# Patient Record
Sex: Female | Born: 1965 | Race: White | Hispanic: No | Marital: Married | Smoking: Never smoker
Health system: Southern US, Community
[De-identification: ages and names within clinical notes are randomized; demographics above are authoritative.]

## PROBLEM LIST (undated history)

## (undated) DIAGNOSIS — F419 Anxiety disorder, unspecified: Secondary | ICD-10-CM

## (undated) DIAGNOSIS — M67439 Ganglion, unspecified wrist: Secondary | ICD-10-CM

## (undated) HISTORY — PX: BREAST CYST ASPIRATION: SHX578

## (undated) HISTORY — PX: TUBAL LIGATION: SHX77

## (undated) HISTORY — DX: Ganglion, unspecified wrist: M67.439

## (undated) HISTORY — DX: Anxiety disorder, unspecified: F41.9

---

## 1999-01-03 ENCOUNTER — Other Ambulatory Visit: Admission: RE | Admit: 1999-01-03 | Discharge: 1999-01-03 | Payer: Self-pay | Admitting: Obstetrics and Gynecology

## 1999-12-21 ENCOUNTER — Other Ambulatory Visit: Admission: RE | Admit: 1999-12-21 | Discharge: 1999-12-21 | Payer: Self-pay | Admitting: Gynecology

## 2000-01-03 ENCOUNTER — Encounter: Admission: RE | Admit: 2000-01-03 | Discharge: 2000-04-02 | Payer: Self-pay | Admitting: Gynecology

## 2000-07-01 ENCOUNTER — Inpatient Hospital Stay (HOSPITAL_COMMUNITY): Admission: AD | Admit: 2000-07-01 | Discharge: 2000-07-03 | Payer: Self-pay | Admitting: Gynecology

## 2000-07-01 ENCOUNTER — Encounter (INDEPENDENT_AMBULATORY_CARE_PROVIDER_SITE_OTHER): Payer: Self-pay | Admitting: Specialist

## 2000-07-06 ENCOUNTER — Inpatient Hospital Stay (HOSPITAL_COMMUNITY): Admission: AD | Admit: 2000-07-06 | Discharge: 2000-07-06 | Payer: Self-pay | Admitting: Gynecology

## 2000-08-05 ENCOUNTER — Other Ambulatory Visit: Admission: RE | Admit: 2000-08-05 | Discharge: 2000-08-05 | Payer: Self-pay | Admitting: Gynecology

## 2001-06-23 ENCOUNTER — Other Ambulatory Visit: Admission: RE | Admit: 2001-06-23 | Discharge: 2001-06-23 | Payer: Self-pay | Admitting: Gynecology

## 2001-12-30 ENCOUNTER — Encounter (INDEPENDENT_AMBULATORY_CARE_PROVIDER_SITE_OTHER): Payer: Self-pay

## 2001-12-30 ENCOUNTER — Inpatient Hospital Stay (HOSPITAL_COMMUNITY): Admission: AD | Admit: 2001-12-30 | Discharge: 2002-01-01 | Payer: Self-pay | Admitting: Gynecology

## 2002-02-04 ENCOUNTER — Other Ambulatory Visit: Admission: RE | Admit: 2002-02-04 | Discharge: 2002-02-04 | Payer: Self-pay | Admitting: Gynecology

## 2003-02-12 ENCOUNTER — Other Ambulatory Visit: Admission: RE | Admit: 2003-02-12 | Discharge: 2003-02-12 | Payer: Self-pay | Admitting: Gynecology

## 2003-03-10 ENCOUNTER — Encounter: Payer: Self-pay | Admitting: Gynecology

## 2003-03-10 ENCOUNTER — Ambulatory Visit (HOSPITAL_COMMUNITY): Admission: RE | Admit: 2003-03-10 | Discharge: 2003-03-10 | Payer: Self-pay | Admitting: Gynecology

## 2003-07-27 ENCOUNTER — Other Ambulatory Visit: Admission: RE | Admit: 2003-07-27 | Discharge: 2003-07-27 | Payer: Self-pay | Admitting: Gynecology

## 2008-12-30 ENCOUNTER — Other Ambulatory Visit: Admission: RE | Admit: 2008-12-30 | Discharge: 2008-12-30 | Payer: Self-pay | Admitting: Gynecology

## 2008-12-30 ENCOUNTER — Encounter: Payer: Self-pay | Admitting: Gynecology

## 2008-12-30 ENCOUNTER — Ambulatory Visit: Payer: Self-pay | Admitting: Gynecology

## 2009-01-04 ENCOUNTER — Ambulatory Visit (HOSPITAL_COMMUNITY): Admission: RE | Admit: 2009-01-04 | Discharge: 2009-01-04 | Payer: Self-pay | Admitting: Gynecology

## 2010-01-12 ENCOUNTER — Ambulatory Visit: Payer: Self-pay | Admitting: Gynecology

## 2010-01-12 ENCOUNTER — Other Ambulatory Visit: Admission: RE | Admit: 2010-01-12 | Discharge: 2010-01-12 | Payer: Self-pay | Admitting: Gynecology

## 2010-01-19 ENCOUNTER — Ambulatory Visit (HOSPITAL_COMMUNITY): Admission: RE | Admit: 2010-01-19 | Discharge: 2010-01-19 | Payer: Self-pay | Admitting: Gynecology

## 2010-06-22 ENCOUNTER — Ambulatory Visit: Payer: Self-pay | Admitting: Gynecology

## 2010-12-08 NOTE — Discharge Summary (Signed)
[facility]  Patient:    Kimberly Mcmahon, Kimberly Mcmahon                        MRN: [id number] Adm. Date:  [id number] Disc. Date: [id number] Attending:  Tonye Royalty Dictator:   Antony Contras, [facility]                           Discharge Summary  DISCHARGE DIAGNOSES:          1. Intrauterine pregnancy at 38 weeks.                               2. Succenturiate placenta.  PROCEDURES:                   Normal spontaneous vaginal delivery of viable infant over an intact perineum with second degree vaginoperineal tear and right and left labia majora tear, which were repaired.  HISTORY OF PRESENT ILLNESS:   The patient is a 45 year old, gravida 3, para 2-0-0-2, LMP October 08, 1999, [facility] July 14, 2000.  Her only prenatal risk factory well-appearing a succenturiate placenta which was noted on a 20-week ultrasound.  PRENATAL LABORATORIES:        Blood type A+.  Antibody screen negative.  RPR, HBSAG, and HIV nonreactive.  Rubella immune.  The patient declined AFP.  GBS was positive.  HOSPITAL COURSE AND TREATMENT:                    The patient was admitted on July 01, 2000, after presenting to the office for prenatal visit and stating that she had decreased fetal movement.  Her cervix was found to be 1-2 cm dilated, 70% effaced, and -2 station.  She was placed on the monitor and found to be contracting every two to three minutes with good fetal heart reactivity.  She was sent to maternity admissions where monitoring was continued and she was found dilated to have progressed to 3-4 cm, 80%, and -1 station.  GBS prophylaxis was initiated and artificial rupture of membranes performed.  The patient did progress to complete dilatation and delivered an Apgar 9 and 9, 7 pound 10 ounce, female infant over an intact perineum, sustaining second vaginoperitoneal tear and right and left labia majora tears, which were remained.  During her postpartum course, she remained  afebrile, had no difficulty voiding, and was able to be discharged on her second postpartum day in satisfactory condition.  LABORATORY DATA:              CBC:  Hematocrit 31.6, hemoglobin 11.1, platelets 278.  DISPOSITION:                  Follow up in six weeks.  DISCHARGE MEDICATIONS:        Continue prenatal vitamins and iron.  Motrin and Tylox for pain. DD:  07/29/00 TD:  07/29/00 Job: [id number]

## 2010-12-08 NOTE — Op Note (Signed)
[facility]  Patient:    SAMARAH, HOGLE Visit Number: [id number] MRN: [id number]          Service Type: OBS Location: 910A 9103 01 Attending Physician:  Douglass Rivers Dictated by:   Douglass Rivers, M.D. Proc. Date: 12/31/01 Admit Date:  12/30/2001 Discharge Date: 01/01/2002                             Operative Report  PREOPERATIVE DIAGNOSES:       Nondesired fertility postpartum.  POSTOPERATIVE DIAGNOSES:      Nondesired fertility postpartum.  PROCEDURE:                    Modified Pomeroy bilateral tubal ligation.  SURGEON:                      Douglass Rivers, M.D.  ANESTHESIA:                   Epidural.  FINDINGS:                     Normal tubes and ovaries bilateral.  PATHOLOGY:                    Mid portion of left and right tube.  PROCEDURE:                    The patient was taken to the operating room. Epidural anesthesia was redosed.  Placed in supine position.  Prepped and draped in usual sterile fashion after adequate anesthesia was assured.  The infraumbilical region was grasped with two Allis clamps and then incision was made.  The incision was carried through to the layer of fascia which was scored and extended laterally with the Mayo scissors.  The peritoneum was entered bluntly with the hemostat and that incision was also extended laterally.  An examining finger was then placed in the incision.  The tubo-ovarian ligament on the right was grasped, stabilized with the finger, and a Babcock was placed.  The ovary was visualized.  The tube was also visualized, grasped with a Babcock, and walked out to the fimbriated end.  Two free ties of 2-0 plain were then placed in the mid portion of the tube and the mid portion was excised.  The endosalpinx was then sharply dissected off additionally and noted to be hemostatic and the tube was returned back to the abdomen.  In a similar fashion the uterus was rotated.  The  tubo-ovarian ligament on the left was grasped.  The ovary was noted to be normal.  The tube was walked out to the fimbriated end and two free ties of 2-0 plain were placed in the mid portion and again the endosalpinx was sharply dissected off. The tube was noted to be hemostatic and returned back to the pelvis.  The fascia was then closed in a running layer with 0 Vicryl.  The skin was closed with 3-0 plain.  The incision was then injected with 0.25% Marcaine solution and Steri-Strips were placed.  The patient tolerated the procedure well. Sponge, lap, and needle counts correct x2.  Transferred back to the PACU in stable condition. Dictated by:   Douglass Rivers, M.D. Attending Physician:  Douglass Rivers DD:  12/31/01 TD:  01/02/02 Job: [id number]

## 2010-12-08 NOTE — Discharge Summary (Signed)
[facility]  Patient:    Kimberly Mcmahon, KIL Visit Number: [id number] MRN: [id number]          Service Type: OBS Location: 910A 9103 01 Attending Physician:  Douglass Rivers Dictated by:   Antony Contras, [facility] Admit Date:  12/30/2001 Discharge Date: 01/01/2002                             Discharge Summary  DISCHARGE DIAGNOSES:          1. Intrauterine pregnancy at term.                               2. Spontaneous onset of labor.                               3. Undesired fertility.  PROCEDURES:                   1. Normal spontaneous vaginal delivery of a                                  viable infant over an intact perineum.                               2. Bilateral tubal sterilization, modified                                  Pomeroy technique.  HISTORY OF PRESENT ILLNESS:   The patient is a 45 year old gravida 4 para 3, 0-0-3, with an EDC of January 09, 2002 by [facility].  The prenatal course was complicated by a previous history of positive GBS with last pregnancy, also undesired fertility.  PRENATAL LABORATORY DATA:     Blood type A-positive.  Antibody screen negative.  RPR, HBsAg, HIV nonreactive.  Rubella immune.  HOSPITAL COURSE:              The patient was admitted on December 30, 2001 with spontaneous onset of labor at 38-4/7th weeks.  She did progress to complete dilatation and delivered an Apgar 9 and 28 female infant weighing 7 pounds 7 ounces over an intact perineum, with no lacerations.  She did request tubal sterilization and the procedure was performed by Dr. Farrel Gobble.  Findings included normal tubes and ovaries bilaterally.  The patient was able to be discharged on her second postpartum day in satisfactory condition.  LABORATORY DATA:              CBC hematocrit 32.3, hemoglobin 10.9, WBC 9; platelets 245,000.  DISPOSITION:                  Follow-up in six weeks.  DISCHARGE MEDICATIONS:        1. Continue prenatal vitamins and iron.                              2. Tylox and Motrin for pain. Dictated by:   Antony Contras, [facility] Attending Physician:  Douglass Rivers DD:  01/26/02 TD:  01/28/02 Job: [id number]

## 2010-12-08 NOTE — H&P (Signed)
[facility]  Patient:    Kimberly Mcmahon, Kimberly Mcmahon                        MRN: [id number] Adm. Date:  [id number] Disc. Date: [id number] Attending:  Tonye Royalty                         History and Physical  HISTORY OF PRESENT ILLNESS:   The patient is a 45 year old, gravida 3, para 3, status post normal spontaneous vaginal delivery on December 10, who presented to the emergency room today to be checked as per my instructions due to the fact that she had called last night and said that she had bilateral breast tenderness.  She is not breastfeeding and has not breastfed in the past. She denied any fever, chills, nausea, or vomiting.  She had also stated that she had fallen on her buttocks three or four days ago.  She has full range of motion and has no neuropathy of her lower extremities and is ambulating without any problems.  She has a small ecchymosis on the right buttocks region.  Breast examination in the sitting and supine positions; both breasts were symmetrical in appearance.  There was no skin discoloration, no nipple inversion, no palpable masses, no supraclavicular, or axillary lymphadenopathy.  There was a mild redness on both breasts in the inferior portion due to the support from her bra that she has been placing.  She had been complaining of engorgement, but today felt much better and I asked her to come in to have her breasts examined as well as to have the lactation specialist give her some tips on helping with her breast tenderness as well as dealing with her engorgement.  PHYSICAL EXAMINATION:  VITAL SIGNS:                  Temperature 97.8, pulse 76, respirations 18, and blood pressure 130/84.  We will await for recommendation from the lactation specialist.  She will be released home and return to the office for her six-week postpartum visit. If she were to have a temperature elevation, she will call me and at that point we could  justify placing her on antibiotics.  At this point this does not appear to be a mastitis and the fact that she is not breastfeeding and there is no source of infection determined clinically at this point.  All questions were answered and will follow accordingly. DD:  07/06/00 TD:  07/06/00 Job: [id number]

## 2010-12-08 NOTE — H&P (Signed)
[facility]  Patient:    Kimberly Mcmahon, Kimberly Mcmahon                         MRN: [id number] Adm. Date:  07/01/00 Attending:  Gaetano Hawthorne. Lily Peer, M.D.                         History and Physical  3CHIEF COMPLAINT:              Contractions.  HISTORY:                      The patient is a 45 year old gravida 3, para 2, with an estimated date of confinement July 14, 2000.  The patient is currently at [redacted] weeks gestation, presented to the office for her prenatal visit and had stated that she had decreased fetal movement.  Her cervix was found to be 1-2 cm dilated, 70% effaced, -2 station.  She was placed on the monitor and found to be contracting every 2-3 minutes apart with good reactivity.  She was sent to maternity admission.  Her contractions were occurring at that point every 3-5 minutes.  She was instructed to ambulate and return back to be reexamined.  An hour later she was found to be 3-4 cm dilated, 80% effaced, -1 station with bulging membranes.  Fetal heart rate tracing was reactive.  The patients prenatal course has essentially been unremarkable.  Her 20 week ultrasound did demonstrate that there was a succenturiate placental low but no gross fetal anomalies, and otherwise her prenatal care had been uneventful with the exception that she was cultured at 37 weeks and found to be positive for GBS.  PAST MEDICAL HISTORY:         She denies any allergies.  She has had two normal spontaneous vaginal deliveries in 1990 and 1993.  Eight hours and six hours duration respectively with an average weight of 7.5 pounds at [redacted] weeks gestation.  She has had a CNB in 1999.  She had some form of eye surgery as a child and does not recall specifically what it was.  REVIEW OF SYSTEMS:            See [employer] form.  PHYSICAL EXAMINATION:  VITAL SIGNS:                  In the office today blood pressure was 130/80. There is no protein or ketone in her urine.  Weight  167 pounds.  Vertex presentation by [facility] maneuver.  HEENT:                        Unremarkable.  NECK:                         Supple, trachea midline, no carotid bruits, no thyromegaly.  LUNGS:                        Clear to auscultation without rhonchi or wheezes.  HEART:                        Regular rate and rhythm without any murmurs or gallops.  BREAST:                       Done during the first trimester was  reported to be normal.  ABDOMEN:                      Gravid uterus, vertex presentation by Thayer Ohm maneuver.  Positive fetal heart tones.  PELVIC:                       As described above.  EXTREMITIES:                  DTR 1+, negative clonus.  PRENATAL LABORATORY:          A+ blood type, negative antibody screen, VDRL was nonreactive.  Hepatitis and HIV were negative.  Rubella with evidence of immunity.  Pap smear was normal.  Diabetes screen was normal.  The patient declined maternal serum alpha fetoprotein, and GBS culture was positive.  Her last hemoglobin in the office was 12 grams on April 08, 2000.  ASSESSMENT:                   A 45 year old gravida 3, para 2 at [redacted] weeks gestation who initially presented to the office for prenatal visit complaining of decreased fetal movement was placed on the monitor and found to be contracting every 2-3 minutes apart with a reassuring fetal heart rate tracing, was sent to [facility].  Her cervix now has changed to 3-4 cm dilated to 80% effaced, -1 station with bulging membranes.  She has a history of positive GBS culture.  Will go ahead and initiate GBS prophylaxis with penicillin G and then proceed with artificial rupture of membranes in anticipated vaginal delivery.  PLAN:                         Admit to labor and delivery as per assessment above. DD:  07/01/00 TD:  07/01/00 Job: [id number]

## 2010-12-20 ENCOUNTER — Other Ambulatory Visit: Payer: Self-pay | Admitting: Gynecology

## 2010-12-20 DIAGNOSIS — Z1231 Encounter for screening mammogram for malignant neoplasm of breast: Secondary | ICD-10-CM

## 2011-01-17 ENCOUNTER — Encounter (INDEPENDENT_AMBULATORY_CARE_PROVIDER_SITE_OTHER): Payer: 59 | Admitting: Gynecology

## 2011-01-17 ENCOUNTER — Encounter: Payer: Self-pay | Admitting: Gynecology

## 2011-01-17 ENCOUNTER — Other Ambulatory Visit (HOSPITAL_COMMUNITY)
Admission: RE | Admit: 2011-01-17 | Discharge: 2011-01-17 | Disposition: A | Discharge status: Home / Self Care | Payer: 59 | Source: Ambulatory Visit | Attending: Gynecology | Admitting: Gynecology

## 2011-01-17 ENCOUNTER — Other Ambulatory Visit: Payer: Self-pay | Admitting: Gynecology

## 2011-01-17 DIAGNOSIS — R635 Abnormal weight gain: Secondary | ICD-10-CM

## 2011-01-17 DIAGNOSIS — Z13220 Encounter for screening for lipoid disorders: Secondary | ICD-10-CM

## 2011-01-17 DIAGNOSIS — Z124 Encounter for screening for malignant neoplasm of cervix: Secondary | ICD-10-CM | POA: Insufficient documentation

## 2011-01-17 DIAGNOSIS — Z833 Family history of diabetes mellitus: Secondary | ICD-10-CM

## 2011-01-17 DIAGNOSIS — Z01419 Encounter for gynecological examination (general) (routine) without abnormal findings: Secondary | ICD-10-CM

## 2011-01-22 ENCOUNTER — Ambulatory Visit (HOSPITAL_COMMUNITY)
Admission: RE | Admit: 2011-01-22 | Discharge: 2011-01-22 | Disposition: A | Discharge status: Home / Self Care | Payer: 59 | Source: Ambulatory Visit | Attending: Gynecology | Admitting: Gynecology

## 2011-01-22 DIAGNOSIS — Z1231 Encounter for screening mammogram for malignant neoplasm of breast: Secondary | ICD-10-CM | POA: Insufficient documentation

## 2011-11-28 ENCOUNTER — Telehealth: Payer: Self-pay | Admitting: *Deleted

## 2011-11-28 NOTE — Telephone Encounter (Signed)
Pt calling c/o itchy breast off and on, no discharge or lumps in breast only itching. Pt has annual scheduled and will make OV if need to examine breast only.

## 2012-01-18 ENCOUNTER — Other Ambulatory Visit (HOSPITAL_COMMUNITY)
Admission: RE | Admit: 2012-01-18 | Discharge: 2012-01-18 | Disposition: A | Discharge status: Home / Self Care | Payer: 59 | Source: Ambulatory Visit | Attending: Gynecology | Admitting: Gynecology

## 2012-01-18 ENCOUNTER — Ambulatory Visit (INDEPENDENT_AMBULATORY_CARE_PROVIDER_SITE_OTHER): Payer: 59 | Admitting: Gynecology

## 2012-01-18 ENCOUNTER — Encounter: Payer: Self-pay | Admitting: Gynecology

## 2012-01-18 VITALS — BP 128/84 | Ht 63.0 in | Wt 177.0 lb

## 2012-01-18 DIAGNOSIS — Z01419 Encounter for gynecological examination (general) (routine) without abnormal findings: Secondary | ICD-10-CM

## 2012-01-18 DIAGNOSIS — Z1159 Encounter for screening for other viral diseases: Secondary | ICD-10-CM | POA: Insufficient documentation

## 2012-01-18 DIAGNOSIS — R635 Abnormal weight gain: Secondary | ICD-10-CM

## 2012-01-18 DIAGNOSIS — N898 Other specified noninflammatory disorders of vagina: Secondary | ICD-10-CM

## 2012-01-18 LAB — CBC WITH DIFFERENTIAL/PLATELET
Basophils Relative: 0 % (ref 0–1)
Hemoglobin: 13.7 g/dL (ref 12.0–15.0)
Lymphs Abs: 2.7 10*3/uL (ref 0.7–4.0)
Monocytes Relative: 7 % (ref 3–12)
Neutro Abs: 3.9 10*3/uL (ref 1.7–7.7)
Neutrophils Relative %: 54 % (ref 43–77)
Platelets: 319 10*3/uL (ref 150–400)
RBC: 4.37 MIL/uL (ref 3.87–5.11)

## 2012-01-18 LAB — WET PREP FOR TRICH, YEAST, CLUE
Clue Cells Wet Prep HPF POC: NONE SEEN
WBC, Wet Prep HPF POC: NONE SEEN

## 2012-01-18 LAB — TSH: TSH: 2.749 u[IU]/mL (ref 0.350–4.500)

## 2012-01-18 LAB — CHOLESTEROL, TOTAL: Cholesterol: 184 mg/dL (ref 0–200)

## 2012-01-18 LAB — HEMOGLOBIN A1C: Mean Plasma Glucose: 94 mg/dL (ref <117)

## 2012-01-18 MED ORDER — FLUCONAZOLE 100 MG PO TABS
100.0000 mg | ORAL_TABLET | Freq: Every day | ORAL | Status: AC
Start: 2012-01-18 — End: 2012-01-25

## 2012-01-18 NOTE — Patient Instructions (Addendum)
Health Maintenance, Females A healthy lifestyle and preventative care can promote health and wellness.  Maintain regular health, dental, and eye exams.   Eat a healthy diet. Foods like vegetables, fruits, whole grains, low-fat dairy products, and lean protein foods contain the nutrients you need without too many calories. Decrease your intake of foods high in solid fats, added sugars, and salt. Get information about a proper diet from your caregiver, if necessary.   Regular physical exercise is one of the most important things you can do for your health. Most adults should get at least 150 minutes of moderate-intensity exercise (any activity that increases your heart rate and causes you to sweat) each week. In addition, most adults need muscle-strengthening exercises on 2 or more days a week.    Maintain a healthy weight. The body mass index (BMI) is a screening tool to identify possible weight problems. It provides an estimate of body fat based on height and weight. Your caregiver can help determine your BMI, and can help you achieve or maintain a healthy weight. For adults 20 years and older:   A BMI below 18.5 is considered underweight.   A BMI of 18.5 to 24.9 is normal.   A BMI of 25 to 29.9 is considered overweight.   A BMI of 30 and above is considered obese.   Maintain normal blood lipids and cholesterol by exercising and minimizing your intake of saturated fat. Eat a balanced diet with plenty of fruits and vegetables. Blood tests for lipids and cholesterol should begin at age 20 and be repeated every 5 years. If your lipid or cholesterol levels are high, you are over 50, or you are a high risk for heart disease, you may need your cholesterol levels checked more frequently.Ongoing high lipid and cholesterol levels should be treated with medicines if diet and exercise are not effective.   If you smoke, find out from your caregiver how to quit. If you do not use tobacco, do not start.    If you are pregnant, do not drink alcohol. If you are breastfeeding, be very cautious about drinking alcohol. If you are not pregnant and choose to drink alcohol, do not exceed 1 drink per day. One drink is considered to be 12 ounces (355 mL) of beer, 5 ounces (148 mL) of wine, or 1.5 ounces (44 mL) of liquor.   Avoid use of street drugs. Do not share needles with anyone. Ask for help if you need support or instructions about stopping the use of drugs.   High blood pressure causes heart disease and increases the risk of stroke. Blood pressure should be checked at least every 1 to 2 years. Ongoing high blood pressure should be treated with medicines, if weight loss and exercise are not effective.   If you are 55 to 45 years old, ask your caregiver if you should take aspirin to prevent strokes.   Diabetes screening involves taking a blood sample to check your fasting blood sugar level. This should be done once every 3 years, after age 45, if you are within normal weight and without risk factors for diabetes. Testing should be considered at a younger age or be carried out more frequently if you are overweight and have at least 1 risk factor for diabetes.   Breast cancer screening is essential preventative care for women. You should practice "breast self-awareness." This means understanding the normal appearance and feel of your breasts and may include breast self-examination. Any changes detected, no matter how   small, should be reported to a caregiver. Women in their 20s and 30s should have a clinical breast exam (CBE) by a caregiver as part of a regular health exam every 1 to 3 years. After age 40, women should have a CBE every year. Starting at age 40, women should consider having a mammogram (breast X-ray) every year. Women who have a family history of breast cancer should talk to their caregiver about genetic screening. Women at a high risk of breast cancer should talk to their caregiver about having  an MRI and a mammogram every year.   The Pap test is a screening test for cervical cancer. Women should have a Pap test starting at age 21. Between ages 21 and 29, Pap tests should be repeated every 2 years. Beginning at age 30, you should have a Pap test every 3 years as long as the past 3 Pap tests have been normal. If you had a hysterectomy for a problem that was not cancer or a condition that could lead to cancer, then you no longer need Pap tests. If you are between ages 65 and 70, and you have had normal Pap tests going back 10 years, you no longer need Pap tests. If you have had past treatment for cervical cancer or a condition that could lead to cancer, you need Pap tests and screening for cancer for at least 20 years after your treatment. If Pap tests have been discontinued, risk factors (such as a new sexual partner) need to be reassessed to determine if screening should be resumed. Some women have medical problems that increase the chance of getting cervical cancer. In these cases, your caregiver may recommend more frequent screening and Pap tests.   The human papillomavirus (HPV) test is an additional test that may be used for cervical cancer screening. The HPV test looks for the virus that can cause the cell changes on the cervix. The cells collected during the Pap test can be tested for HPV. The HPV test could be used to screen women aged 30 years and older, and should be used in women of any age who have unclear Pap test results. After the age of 30, women should have HPV testing at the same frequency as a Pap test.   Colorectal cancer can be detected and often prevented. Most routine colorectal cancer screening begins at the age of 50 and continues through age 75. However, your caregiver may recommend screening at an earlier age if you have risk factors for colon cancer. On a yearly basis, your caregiver may provide home test kits to check for hidden blood in the stool. Use of a small camera at  the end of a tube, to directly examine the colon (sigmoidoscopy or colonoscopy), can detect the earliest forms of colorectal cancer. Talk to your caregiver about this at age 50, when routine screening begins. Direct examination of the colon should be repeated every 5 to 10 years through age 75, unless early forms of pre-cancerous polyps or small growths are found.   Hepatitis C blood testing is recommended for all people born from 1945 through 1965 and any individual with known risks for hepatitis C.   Practice safe sex. Use condoms and avoid high-risk sexual practices to reduce the spread of sexually transmitted infections (STIs). Sexually active women aged 25 and younger should be checked for Chlamydia, which is a common sexually transmitted infection. Older women with new or multiple partners should also be tested for Chlamydia. Testing for other   STIs is recommended if you are sexually active and at increased risk.   Osteoporosis is a disease in which the bones lose minerals and strength with aging. This can result in serious bone fractures. The risk of osteoporosis can be identified using a bone density scan. Women ages 65 and over and women at risk for fractures or osteoporosis should discuss screening with their caregivers. Ask your caregiver whether you should be taking a calcium supplement or vitamin D to reduce the rate of osteoporosis.   Menopause can be associated with physical symptoms and risks. Hormone replacement therapy is available to decrease symptoms and risks. You should talk to your caregiver about whether hormone replacement therapy is right for you.   Use sunscreen with a sun protection factor (SPF) of 30 or greater. Apply sunscreen liberally and repeatedly throughout the day. You should seek shade when your shadow is shorter than you. Protect yourself by wearing long sleeves, pants, a wide-brimmed hat, and sunglasses year round, whenever you are outdoors.   Notify your caregiver  of new moles or changes in moles, especially if there is a change in shape or color. Also notify your caregiver if a mole is larger than the size of a pencil eraser.   Stay current with your immunizations.  Document Released: 01/22/2011 Document Revised: 06/28/2011 Document Reviewed: 01/22/2011 [employer] Patient Information 2012 [employer].                                                   Cholesterol Control Diet  Cholesterol levels in your body are determined significantly by your diet. Cholesterol levels may also be related to heart disease. The following material helps to explain this relationship and discusses what you can do to help keep your heart healthy. Not all cholesterol is bad. Low-density lipoprotein (LDL) cholesterol is the "bad" cholesterol. It may cause fatty deposits to build up inside your arteries. High-density lipoprotein (HDL) cholesterol is "good." It helps to remove the "bad" LDL cholesterol from your blood. Cholesterol is a very important risk factor for heart disease. Other risk factors are high blood pressure, smoking, stress, heredity, and weight. The heart muscle gets its supply of blood through the coronary arteries. If your LDL cholesterol is high and your HDL cholesterol is low, you are at risk for having fatty deposits build up in your coronary arteries. This leaves less room through which blood can flow. Without sufficient blood and oxygen, the heart muscle cannot function properly and you may feel chest pains (angina pectoris). When a coronary artery closes up entirely, a part of the heart muscle may die, causing a heart attack (myocardial infarction). CHECKING CHOLESTEROL When your caregiver sends your blood to a lab to be analyzed for cholesterol, a complete lipid (fat) profile may be done. With this test, the total amount of cholesterol and levels of LDL and HDL are determined. Triglycerides are a type of fat that circulates in the blood and can also be used to  determine heart disease risk. The list below describes what the numbers should be: Test: Total Cholesterol.  Less than 200 mg/dl.  Test: LDL "bad cholesterol."  Less than 100 mg/dl.   Less than 70 mg/dl if you are at very high risk of a heart attack or sudden cardiac death.  Test: HDL "good cholesterol."  Greater than 50 mg/dl for   women.   Greater than 40 mg/dl for men.  Test: Triglycerides.  Less than 150 mg/dl.  CONTROLLING CHOLESTEROL WITH DIET Although exercise and lifestyle factors are important, your diet is key. That is because certain foods are known to raise cholesterol and others to lower it. The goal is to balance foods for their effect on cholesterol and more importantly, to replace saturated and trans fat with other types of fat, such as monounsaturated fat, polyunsaturated fat, and omega-3 fatty acids. On average, a person should consume no more than 15 to 17 g of saturated fat daily. Saturated and trans fats are considered "bad" fats, and they will raise LDL cholesterol. Saturated fats are primarily found in animal products such as meats, butter, and cream. However, that does not mean you need to sacrifice all your favorite foods. Today, there are good tasting, low-fat, low-cholesterol substitutes for most of the things you like to eat. Choose low-fat or nonfat alternatives. Choose round or loin cuts of red meat, since these types of cuts are lowest in fat and cholesterol. Chicken (without the skin), fish, veal, and ground turkey breast are excellent choices. Eliminate fatty meats, such as hot dogs and salami. Even shellfish have little or no saturated fat. Have a 3 oz (85 g) portion when you eat lean meat, poultry, or fish. Trans fats are also called "partially hydrogenated oils." They are oils that have been scientifically manipulated so that they are solid at room temperature resulting in a longer shelf life and improved taste and texture of foods in which they are added. Trans  fats are found in stick margarine, some tub margarines, cookies, crackers, and baked goods.  When baking and cooking, oils are an excellent substitute for butter. The monounsaturated oils are especially beneficial since it is believed they lower LDL and raise HDL. The oils you should avoid entirely are saturated tropical oils, such as coconut and palm.  Remember to eat liberally from food groups that are naturally free of saturated and trans fat, including fish, fruit, vegetables, beans, grains (barley, rice, couscous, bulgur wheat), and pasta (without cream sauces).  IDENTIFYING FOODS THAT LOWER CHOLESTEROL  Soluble fiber may lower your cholesterol. This type of fiber is found in fruits such as apples, vegetables such as broccoli, potatoes, and carrots, legumes such as beans, peas, and lentils, and grains such as barley. Foods fortified with plant sterols (phytosterol) may also lower cholesterol. You should eat at least 2 g per day of these foods for a cholesterol lowering effect.  Read package labels to identify low-saturated fats, trans fats free, and low-fat foods at the supermarket. Select cheeses that have only 2 to 3 g saturated fat per ounce. Use a heart-healthy tub margarine that is free of trans fats or partially hydrogenated oil. When buying baked goods (cookies, crackers), avoid partially hydrogenated oils. Breads and muffins should be made from whole grains (whole-wheat or whole oat flour, instead of "flour" or "enriched flour"). Buy non-creamy canned soups with reduced salt and no added fats.  FOOD PREPARATION TECHNIQUES  Never deep-fry. If you must fry, either stir-fry, which uses very little fat, or use non-stick cooking sprays. When possible, broil, bake, or roast meats, and steam vegetables. Instead of dressing vegetables with butter or margarine, use lemon and herbs, applesauce and cinnamon (for squash and sweet potatoes), nonfat yogurt, salsa, and low-fat dressings for salads.    LOW-SATURATED FAT / LOW-FAT FOOD SUBSTITUTES Meats / Saturated Fat (g)  Avoid: Steak, marbled (3 oz/85 g) /   11 g   Choose: Steak, lean (3 oz/85 g) / 4 g   Avoid: Hamburger (3 oz/85 g) / 7 g   Choose: Hamburger, lean (3 oz/85 g) / 5 g   Avoid: Ham (3 oz/85 g) / 6 g   Choose: Ham, lean cut (3 oz/85 g) / 2.4 g   Avoid: Chicken, with skin, dark meat (3 oz/85 g) / 4 g   Choose: Chicken, skin removed, dark meat (3 oz/85 g) / 2 g   Avoid: Chicken, with skin, light meat (3 oz/85 g) / 2.5 g   Choose: Chicken, skin removed, light meat (3 oz/85 g) / 1 g  Dairy / Saturated Fat (g)  Avoid: Whole milk (1 cup) / 5 g   Choose: Low-fat milk, 2% (1 cup) / 3 g   Choose: Low-fat milk, 1% (1 cup) / 1.5 g   Choose: Skim milk (1 cup) / 0.3 g   Avoid: Hard cheese (1 oz/28 g) / 6 g   Choose: Skim milk cheese (1 oz/28 g) / 2 to 3 g   Avoid: Cottage cheese, 4% fat (1 cup) / 6.5 g   Choose: Low-fat cottage cheese, 1% fat (1 cup) / 1.5 g   Avoid: Ice cream (1 cup) / 9 g   Choose: Sherbet (1 cup) / 2.5 g   Choose: Nonfat frozen yogurt (1 cup) / 0.3 g   Choose: Frozen fruit bar / trace   Avoid: Whipped cream (1 tbs) / 3.5 g   Choose: Nondairy whipped topping (1 tbs) / 1 g  Condiments / Saturated Fat (g)  Avoid: Mayonnaise (1 tbs) / 2 g   Choose: Low-fat mayonnaise (1 tbs) / 1 g   Avoid: Butter (1 tbs) / 7 g   Choose: Extra light margarine (1 tbs) / 1 g   Avoid: Coconut oil (1 tbs) / 11.8 g   Choose: Olive oil (1 tbs) / 1.8 g   Choose: Corn oil (1 tbs) / 1.7 g   Choose: Safflower oil (1 tbs) / 1.2 g   Choose: Sunflower oil (1 tbs) / 1.4 g   Choose: Soybean oil (1 tbs) / 2.4 g   Choose: Canola oil (1 tbs) / 1 g  Document Released: 07/09/2005 Document Revised: 03/21/2011 Document Reviewed: 12/28/2010 [employer] Patient Information 2012 [employer].  Exercise to Lose Weight Exercise and a healthy diet may help you lose weight. Your doctor may suggest specific  exercises. EXERCISE IDEAS AND TIPS  Choose low-cost things you enjoy doing, such as walking, bicycling, or exercising to workout videos.   Take stairs instead of the elevator.   Walk during your lunch break.   Park your car further away from work or school.   Go to a gym or an exercise class.   Start with 5 to 10 minutes of exercise each day. Build up to 30 minutes of exercise 4 to 6 days a week.   Wear shoes with good support and comfortable clothes.   Stretch before and after working out.   Work out until you breathe harder and your heart beats faster.   Drink extra water when you exercise.   Do not do so much that you hurt yourself, feel dizzy, or get very short of breath.  Exercises that burn about 150 calories:  Running 1  miles in 15 minutes.   Playing volleyball for 45 to 60 minutes.   Washing and waxing a car for 45 to 60 minutes.   Playing touch football   for 45 minutes.   Walking 1  miles in 35 minutes.   Pushing a stroller 1  miles in 30 minutes.   Playing basketball for 30 minutes.   Raking leaves for 30 minutes.   Bicycling 5 miles in 30 minutes.   Walking 2 miles in 30 minutes.   Dancing for 30 minutes.   Shoveling snow for 15 minutes.   Swimming laps for 20 minutes.   Walking up stairs for 15 minutes.   Bicycling 4 miles in 15 minutes.   Gardening for 30 to 45 minutes.   Jumping rope for 15 minutes.   Washing windows or floors for 45 to 60 minutes.  Document Released: 08/11/2010 Document Revised: 03/21/2011 Document Reviewed: 08/11/2010 [employer] Patient Information 2012 [employer].  

## 2012-01-18 NOTE — Progress Notes (Signed)
Kimberly Mcmahon April 14, 1966 [id number]   History:    46 y.o.  for annual gyn exam with the only complaint being of a slight vaginal discharge with some pruritus. Patient is having normal menstrual cycles. Patient with prior tubal sterilization procedure. Review of her record indicated she was weighing 169 last year is up to 177. Her mammogram scheduled for next month and she frequently does her self breast examination. 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  Past medical history,surgical history, family history and social history were all reviewed and documented in the EPIC chart.  Gynecologic History Patient's last menstrual period was 01/07/2012. Contraception: tubal ligation Last Pap: 2012. Results were: normal Last mammogram: 2012. Results were: normal  Obstetric History OB History    Grav Para Term Preterm Abortions TAB SAB Ect Mult Living   4 4 4       4      # Outc Date GA Lbr Len/2nd Wgt Sex Del Anes PTL Lv   1 TRM     M SVD  No Yes   2 TRM     M SVD  No Yes   3 TRM     M SVD  No Yes   4 TRM     F SVD  No Yes       ROS: A ROS was performed and pertinent positives and negatives are included in the history.  GENERAL: No fevers or chills. HEENT: No change in vision, no earache, sore throat or sinus congestion. NECK: No pain or stiffness. CARDIOVASCULAR: No chest pain or pressure. No palpitations. PULMONARY: No shortness of breath, cough or wheeze. GASTROINTESTINAL: No abdominal pain, nausea, vomiting or diarrhea, melena or bright red blood per rectum. GENITOURINARY: No urinary frequency, urgency, hesitancy or dysuria. MUSCULOSKELETAL: No joint or muscle pain, no back pain, no recent trauma. DERMATOLOGIC: No rash, no itching, no lesions. ENDOCRINE: No  polyuria, polydipsia, no heat or cold intolerance. No recent change in weight. HEMATOLOGICAL: No anemia or easy bruising or bleeding. NEUROLOGIC: No headache, seizures, numbness, tingling or weakness. PSYCHIATRIC: No depression, no loss of interest in normal activity or change in sleep pattern.     Exam: chaperone present  BP 128/84  Ht 5\' 3"  (1.6 m)  Wt 177 lb (80.287 kg)  BMI 31.35 kg/m2  LMP 01/07/2012  Body mass index is 31.35 kg/(m^2).  General appearance : Well developed well nourished female. No acute distress HEENT: Neck supple, trachea midline, no carotid bruits, no thyroidmegaly Lungs: Clear to auscultation, no rhonchi or wheezes, or rib retractions  Heart: Regular rate and rhythm, no murmurs or gallops Breast:Examined in sitting and supine position were symmetrical in appearance, no palpable masses or tenderness,  no skin retraction, no nipple inversion, no nipple discharge, no skin discoloration, no axillary or supraclavicular lymphadenopathy Abdomen: no palpable masses or tenderness, no rebound or guarding Extremities: no edema or skin discoloration or tenderness  Pelvic:  Bartholin, Urethra, Skene Glands: Within normal limits             Vagina: No gross lesions or discharge  Cervix: No gross lesions or discharge  Uterus  anteverted, normal size, shape and consistency, non-tender and mobile  Adnexa  Without masses or tenderness  Anus and perineum  normal   Rectovaginal  normal sphincter tone without palpated masses or tenderness             Hemoccult not done  Wet prep few bacteria     Assessment/Plan:  46 y.o. female for annual exam with 8 pound weight gain since last year. Negative wet prep but due to her external pruritus a will call in a prescription of Diflucan 150 mg to take 1 by mouth. The following labs will be drawn: CBC, hemoglobin A1c, TSH, screening cholesterol, urinalysis and Pap smear. We discussed a new screening guidelines and we'll follow with Pap  smears every 3 years starting today. Patient was encouraged to continue to do her monthly self breast examination and to take her calcium and vitamin D for osteoporosis prevention. Instruction handout on exercise and cholesterol lowering diet was provided as well.   Ok Edwards MD, 2:43 PM 01/18/2012

## 2012-01-19 LAB — URINALYSIS W MICROSCOPIC + REFLEX CULTURE
Bacteria, UA: NONE SEEN
Casts: NONE SEEN
Glucose, UA: NEGATIVE mg/dL
Hgb urine dipstick: NEGATIVE
Ketones, ur: NEGATIVE mg/dL
Leukocytes, UA: NEGATIVE
Protein, ur: NEGATIVE mg/dL

## 2012-01-23 ENCOUNTER — Other Ambulatory Visit: Payer: Self-pay | Admitting: Gynecology

## 2012-01-23 DIAGNOSIS — Z1231 Encounter for screening mammogram for malignant neoplasm of breast: Secondary | ICD-10-CM

## 2012-02-13 ENCOUNTER — Ambulatory Visit (HOSPITAL_COMMUNITY)
Admission: RE | Admit: 2012-02-13 | Discharge: 2012-02-13 | Disposition: A | Discharge status: Home / Self Care | Payer: 59 | Source: Ambulatory Visit | Attending: Gynecology | Admitting: Gynecology

## 2012-02-13 DIAGNOSIS — Z1231 Encounter for screening mammogram for malignant neoplasm of breast: Secondary | ICD-10-CM | POA: Insufficient documentation

## 2012-02-15 ENCOUNTER — Other Ambulatory Visit: Payer: Self-pay | Admitting: Gynecology

## 2012-02-20 ENCOUNTER — Other Ambulatory Visit: Payer: Self-pay | Admitting: *Deleted

## 2012-02-20 DIAGNOSIS — R928 Other abnormal and inconclusive findings on diagnostic imaging of breast: Secondary | ICD-10-CM

## 2012-02-26 ENCOUNTER — Other Ambulatory Visit: Payer: Self-pay | Admitting: Gynecology

## 2012-02-26 ENCOUNTER — Ambulatory Visit
Admission: RE | Admit: 2012-02-26 | Discharge: 2012-02-26 | Disposition: A | Discharge status: Home / Self Care | Payer: 59 | Source: Ambulatory Visit | Attending: Gynecology | Admitting: Gynecology

## 2012-02-26 DIAGNOSIS — R928 Other abnormal and inconclusive findings on diagnostic imaging of breast: Secondary | ICD-10-CM

## 2012-02-27 ENCOUNTER — Telehealth: Payer: Self-pay | Admitting: *Deleted

## 2012-02-27 NOTE — Telephone Encounter (Signed)
Pt said that her mammogram result came back abnormal and biopsy was recommended.  Pt has appointment on 03/05/12 at breast center. Pt thought her recent lab work in June annual showed any abnormality, pt informed it did not. She will follow with breast center as directed.

## 2012-03-05 ENCOUNTER — Other Ambulatory Visit: Payer: Self-pay | Admitting: Gynecology

## 2012-03-05 ENCOUNTER — Ambulatory Visit
Admission: RE | Admit: 2012-03-05 | Discharge: 2012-03-05 | Disposition: A | Discharge status: Home / Self Care | Payer: 59 | Source: Ambulatory Visit | Attending: Gynecology | Admitting: Gynecology

## 2012-03-05 DIAGNOSIS — R928 Other abnormal and inconclusive findings on diagnostic imaging of breast: Secondary | ICD-10-CM

## 2012-06-06 ENCOUNTER — Ambulatory Visit (INDEPENDENT_AMBULATORY_CARE_PROVIDER_SITE_OTHER): Payer: 59 | Admitting: Women's Health

## 2012-06-06 ENCOUNTER — Encounter: Payer: Self-pay | Admitting: Women's Health

## 2012-06-06 DIAGNOSIS — N898 Other specified noninflammatory disorders of vagina: Secondary | ICD-10-CM

## 2012-06-06 DIAGNOSIS — B373 Candidiasis of vulva and vagina: Secondary | ICD-10-CM

## 2012-06-06 DIAGNOSIS — A499 Bacterial infection, unspecified: Secondary | ICD-10-CM

## 2012-06-06 DIAGNOSIS — M545 Low back pain: Secondary | ICD-10-CM

## 2012-06-06 DIAGNOSIS — B9689 Other specified bacterial agents as the cause of diseases classified elsewhere: Secondary | ICD-10-CM

## 2012-06-06 DIAGNOSIS — N760 Acute vaginitis: Secondary | ICD-10-CM

## 2012-06-06 LAB — URINALYSIS W MICROSCOPIC + REFLEX CULTURE
Bilirubin Urine: NEGATIVE
Ketones, ur: NEGATIVE mg/dL
Specific Gravity, Urine: <1.005 — ABNORMAL LOW (ref 1.005–1.030)
Urobilinogen, UA: 0.2 mg/dL (ref 0.0–1.0)

## 2012-06-06 LAB — WET PREP FOR TRICH, YEAST, CLUE: Trich, Wet Prep: NONE SEEN

## 2012-06-06 MED ORDER — METRONIDAZOLE 0.75 % VA GEL: VAGINAL | Status: DC

## 2012-06-06 MED ORDER — FLUCONAZOLE 150 MG PO TABS: 150.0000 mg | ORAL_TABLET | Freq: Once | ORAL | Status: DC

## 2012-06-06 MED ORDER — IBUPROFEN 600 MG PO TABS: 600.0000 mg | ORAL_TABLET | Freq: Three times a day (TID) | ORAL | Status: DC | PRN

## 2012-06-06 NOTE — Progress Notes (Signed)
Patient ID: Kimberly Mcmahon, female   DOB: 11-Dec-1965, 46 y.o.   MRN: [id number] Presents with complaint of lower backache, occasional increased vaginal discharge. Denies any urinary symptoms of increased frequency, burning, or urgency. Denies a fever. Regular monthly cycle/BTL. 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  Exam: No CVAT, pain is more lower lumbar area. Abdomen soft nontender, no radiation of pain. External genitalia within normal limits, speculum exam moderate amount of a clear discharge noted wet prep was positive for yeast, amines, clues, and TNTC bacteria. Bimanual no CMT or adnexal fullness or tenderness. UA: Negative  Lower back strain BV/yeast  Plan: Diflucan 150 by mouth x1 dose, MetroGel vaginal cream 1 applicator at bedtime x5, alcohol precautions reviewed. Motrin 600 every 8 hours as needed for low back pain, encouraged to increase rest and decrease lifting. Followup with  orthopedist if continued back pain.

## 2012-06-06 NOTE — Patient Instructions (Addendum)

## 2012-11-24 ENCOUNTER — Ambulatory Visit (INDEPENDENT_AMBULATORY_CARE_PROVIDER_SITE_OTHER): Payer: 59 | Admitting: Gynecology

## 2012-11-24 ENCOUNTER — Encounter: Payer: Self-pay | Admitting: Gynecology

## 2012-11-24 VITALS — BP 128/76

## 2012-11-24 DIAGNOSIS — N760 Acute vaginitis: Secondary | ICD-10-CM

## 2012-11-24 DIAGNOSIS — R300 Dysuria: Secondary | ICD-10-CM

## 2012-11-24 DIAGNOSIS — N898 Other specified noninflammatory disorders of vagina: Secondary | ICD-10-CM

## 2012-11-24 DIAGNOSIS — A499 Bacterial infection, unspecified: Secondary | ICD-10-CM

## 2012-11-24 LAB — URINALYSIS W MICROSCOPIC + REFLEX CULTURE
Bilirubin Urine: NEGATIVE
Glucose, UA: NEGATIVE mg/dL
Hgb urine dipstick: NEGATIVE
Ketones, ur: NEGATIVE mg/dL
Leukocytes, UA: NEGATIVE
Protein, ur: NEGATIVE mg/dL

## 2012-11-24 LAB — WET PREP FOR TRICH, YEAST, CLUE
Trich, Wet Prep: NONE SEEN
Yeast Wet Prep HPF POC: NONE SEEN

## 2012-11-24 MED ORDER — METRONIDAZOLE 500 MG PO TABS: 500.0000 mg | ORAL_TABLET | Freq: Two times a day (BID) | ORAL | Status: DC

## 2012-11-24 NOTE — Addendum Note (Signed)
Addended by: Bertram Savin A on: 11/24/2012 02:21 PM   Modules accepted: Orders

## 2012-11-24 NOTE — Patient Instructions (Addendum)
Bacterial Vaginosis Bacterial vaginosis (BV) is a vaginal infection where the normal balance of bacteria in the vagina is disrupted. The normal balance is then replaced by an overgrowth of certain bacteria. There are several different kinds of bacteria that can cause BV. BV is the most common vaginal infection in women of childbearing age. CAUSES   The cause of BV is not fully understood. BV develops when there is an increase or imbalance of harmful bacteria.  Some activities or behaviors can upset the normal balance of bacteria in the vagina and put women at increased risk including:  Having a new sex partner or multiple sex partners.  Douching.  Using an intrauterine device (IUD) for contraception.  It is not clear what role sexual activity plays in the development of BV. However, women that have never had sexual intercourse are rarely infected with BV. Women do not get BV from toilet seats, bedding, swimming pools or from touching objects around them.  SYMPTOMS   Grey vaginal discharge.  A fish-like odor with discharge, especially after sexual intercourse.  Itching or burning of the vagina and vulva.  Burning or pain with urination.  Some women have no signs or symptoms at all. DIAGNOSIS  Your caregiver must examine the vagina for signs of BV. Your caregiver will perform lab tests and look at the sample of vaginal fluid through a microscope. They will look for bacteria and abnormal cells (clue cells), a pH test higher than 4.5, and a positive amine test all associated with BV.  RISKS AND COMPLICATIONS   Pelvic inflammatory disease (PID).  Infections following gynecology surgery.  Developing HIV.  Developing herpes virus. TREATMENT  Sometimes BV will clear up without treatment. However, all women with symptoms of BV should be treated to avoid complications, especially if gynecology surgery is planned. Female partners generally do not need to be treated. However, BV may spread  between female sex partners so treatment is helpful in preventing a recurrence of BV.   BV may be treated with antibiotics. The antibiotics come in either pill or vaginal cream forms. Either can be used with nonpregnant or pregnant women, but the recommended dosages differ. These antibiotics are not harmful to the baby.  BV can recur after treatment. If this happens, a second round of antibiotics will often be prescribed.  Treatment is important for pregnant women. If not treated, BV can cause a premature delivery, especially for a pregnant woman who had a premature birth in the past. All pregnant women who have symptoms of BV should be checked and treated.  For chronic reoccurrence of BV, treatment with a type of prescribed gel vaginally twice a week is helpful. HOME CARE INSTRUCTIONS   Finish all medication as directed by your caregiver.  Do not have sex until treatment is completed.  Tell your sexual partner that you have a vaginal infection. They should see their caregiver and be treated if they have problems, such as a mild rash or itching.  Practice safe sex. Use condoms. Only have 1 sex partner. PREVENTION  Basic prevention steps can help reduce the risk of upsetting the natural balance of bacteria in the vagina and developing BV:  Do not have sexual intercourse (be abstinent).  Do not douche.  Use all of the medicine prescribed for treatment of BV, even if the signs and symptoms go away.  Tell your sex partner if you have BV. That way, they can be treated, if needed, to prevent reoccurrence. SEEK MEDICAL CARE IF:     Your symptoms are not improving after 3 days of treatment.  You have increased discharge, pain, or fever. MAKE SURE YOU:   Understand these instructions.  Will watch your condition.  Will get help right away if you are not doing well or get worse. FOR MORE INFORMATION  Division of STD Prevention (DSTDP), Centers for Disease Control and Prevention:  www.cdc.gov/std American [employer] Association (ASHA): www.ashastd.org  Document Released: 07/09/2005 Document Revised: 10/01/2011 Document Reviewed: 12/30/2008 Exit[employer] Patient Information 2013 [employer].  

## 2012-11-24 NOTE — Progress Notes (Signed)
Patient presented to the office today complaining of a few days of a slight discharge with some odor. Patient with a steady sexual partner. Patient has normal menstrual cycle. Patient with previous tubal sterilization procedure. Patient denies any dysuria or frequency or any fever, chills, nausea, or vomiting or back pain.  Exam: Bartholin urethra Skene was within normal limits Vagina: Fishy white-colored discharge Cervix: No gross lesions on inspection Bimanual exam: Not done Adnexa: Not done rectal exam: Not done  Urinalysis negative  Wet prep A:mine POS, clue cells moderate, bacteria too numerous to count, WBC rare   Assessment/plan: Bacterial vaginosis. Will be treated with Flagyl 500 mg twice a day for 5 days.

## 2012-11-25 ENCOUNTER — Telehealth: Payer: Self-pay | Admitting: *Deleted

## 2012-11-25 NOTE — Telephone Encounter (Signed)
Pt called asking if a fungal medication given by her foot doctor would have any type of interaction with the flagyl doctor JF gave her. I explained to pt that the pharmacist would let her know if there was a interaction and to double check with her [facility].

## 2013-01-30 ENCOUNTER — Other Ambulatory Visit: Payer: Self-pay

## 2013-01-30 DIAGNOSIS — M545 Low back pain: Secondary | ICD-10-CM

## 2013-01-30 MED ORDER — IBUPROFEN 600 MG PO TABS: 600.0000 mg | ORAL_TABLET | Freq: Three times a day (TID) | ORAL | Status: DC | PRN

## 2013-01-30 NOTE — Telephone Encounter (Signed)
We will contact patient to schedule yearly exam as she was due end of June.

## 2013-04-03 ENCOUNTER — Encounter: Payer: 59 | Admitting: Gynecology

## 2013-04-20 ENCOUNTER — Ambulatory Visit (INDEPENDENT_AMBULATORY_CARE_PROVIDER_SITE_OTHER): Payer: 59 | Admitting: Gynecology

## 2013-04-20 ENCOUNTER — Encounter: Payer: Self-pay | Admitting: Gynecology

## 2013-04-20 ENCOUNTER — Other Ambulatory Visit: Payer: Self-pay

## 2013-04-20 VITALS — BP 122/80 | Ht 63.0 in | Wt 164.0 lb

## 2013-04-20 DIAGNOSIS — Z1231 Encounter for screening mammogram for malignant neoplasm of breast: Secondary | ICD-10-CM

## 2013-04-20 DIAGNOSIS — Z01419 Encounter for gynecological examination (general) (routine) without abnormal findings: Secondary | ICD-10-CM

## 2013-04-20 DIAGNOSIS — M549 Dorsalgia, unspecified: Secondary | ICD-10-CM | POA: Insufficient documentation

## 2013-04-20 DIAGNOSIS — M545 Low back pain: Secondary | ICD-10-CM

## 2013-04-20 DIAGNOSIS — Z23 Encounter for immunization: Secondary | ICD-10-CM

## 2013-04-20 DIAGNOSIS — F419 Anxiety disorder, unspecified: Secondary | ICD-10-CM | POA: Insufficient documentation

## 2013-04-20 LAB — CBC WITH DIFFERENTIAL/PLATELET
Basophils Absolute: 0 10*3/uL (ref 0.0–0.1)
Basophils Relative: 0 % (ref 0–1)
Eosinophils Relative: 3 % (ref 0–5)
HCT: 39.7 % (ref 36.0–46.0)
MCH: 31.2 pg (ref 26.0–34.0)
MCHC: 34.8 g/dL (ref 30.0–36.0)
MCV: 89.6 fL (ref 78.0–100.0)
Monocytes Absolute: 0.4 10*3/uL (ref 0.1–1.0)
Monocytes Relative: 7 % (ref 3–12)
RDW: 13.6 % (ref 11.5–15.5)

## 2013-04-20 LAB — COMPREHENSIVE METABOLIC PANEL
AST: 10 U/L (ref 0–37)
Alkaline Phosphatase: 51 U/L (ref 39–117)
BUN: 16 mg/dL (ref 6–23)
Calcium: 9.4 mg/dL (ref 8.4–10.5)
Creat: 0.71 mg/dL (ref 0.50–1.10)
Glucose, Bld: 94 mg/dL (ref 70–99)

## 2013-04-20 LAB — CHOLESTEROL, TOTAL: Cholesterol: 175 mg/dL (ref 0–200)

## 2013-04-20 MED ORDER — IBUPROFEN 600 MG PO TABS: 600.0000 mg | ORAL_TABLET | Freq: Three times a day (TID) | ORAL | Status: DC | PRN

## 2013-04-20 NOTE — Patient Instructions (Signed)

## 2013-04-20 NOTE — Addendum Note (Signed)
Addended by: Bertram Savin A on: 04/20/2013 09:11 AM   Modules accepted: Orders

## 2013-04-20 NOTE — Progress Notes (Signed)
Kimberly Mcmahon Mar 10, 1966 [id number]   History:    47 y.o.  for annual gyn exam with no complaints today. Review of her records indicated that last year had a left breast ultrasound guided cyst aspiration. Patient is having normal menstrual cycles. 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 She does suffer times her back pain and takes ibuprofen for relief. Patient believes her Tdap vaccine is up to date but would like to receive the flu vaccine today.   Past medical history,surgical history, family history and social history were all reviewed and documented in the EPIC chart.  Gynecologic History Patient's last menstrual period was 04/01/2013. Contraception: tubal ligation Last Pap: 2013. Results were: normal Last mammogram: see above. Results were: see above  Obstetric History OB History  Gravida Para Term Preterm AB SAB TAB Ectopic Multiple Living  4 4 4       4     # Outcome Date GA Lbr Len/2nd Weight Sex Delivery Anes PTL Lv  4 TRM     F SVD  N Y  3 TRM     M SVD  N Y  2 TRM     M SVD  N Y  1 TRM     M SVD  N Y       ROS: A ROS was performed and pertinent positives and negatives are included in the history.  GENERAL: No fevers or chills. HEENT: No change in vision, no earache, sore throat or sinus congestion. NECK: No pain or stiffness. CARDIOVASCULAR: No chest pain or pressure. No palpitations. PULMONARY: No shortness of breath, cough or wheeze. GASTROINTESTINAL: No abdominal pain, nausea, vomiting or diarrhea, melena or bright red blood per rectum. GENITOURINARY: No urinary frequency, urgency, hesitancy or dysuria. MUSCULOSKELETAL: No joint or muscle pain, no back pain, no recent trauma. DERMATOLOGIC:  No rash, no itching, no lesions. ENDOCRINE: No polyuria, polydipsia, no heat or cold intolerance. No recent change in weight. HEMATOLOGICAL: No anemia or easy bruising or bleeding. NEUROLOGIC: No headache, seizures, numbness, tingling or weakness. PSYCHIATRIC: No depression, no loss of interest in normal activity or change in sleep pattern.     Exam: chaperone present  BP 122/80  Ht 5\' 3"  (1.6 m)  Wt 164 lb (74.39 kg)  BMI 29.06 kg/m2  LMP 04/01/2013  Body mass index is 29.06 kg/(m^2).  General appearance : Well developed well nourished female. No acute distress HEENT: Neck supple, trachea midline, no carotid bruits, no thyroidmegaly Lungs: Clear to auscultation, no rhonchi or wheezes, or rib retractions  Heart: Regular rate and rhythm, no murmurs or gallops Breast:Examined in sitting and supine position were symmetrical in appearance, no palpable masses or tenderness,  no skin retraction, no nipple inversion, no nipple discharge, no skin discoloration, no axillary or supraclavicular lymphadenopathy Abdomen: no palpable masses or tenderness, no rebound or guarding Extremities: no edema or skin discoloration or tenderness  Pelvic:  Bartholin, Urethra, Skene Glands: Within normal limits             Vagina: No gross lesions or discharge  Cervix: No gross lesions or discharge  Uterus anteverted, normal size, shape and consistency, non-tender and mobile  Adnexa  Without masses or tenderness  Anus and perineum  normal  Rectovaginal  normal sphincter tone without palpated masses or tenderness             Hemoccult none indicated     Assessment/Plan:  47 y.o. 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 Pap smear not done today in accordance to the new  guidelines. Patient reminded to schedule her mammogram at her request for a 3-D this year because her history of cyst. We discussed importance of monthly soap is examination. Flu vaccine administered today. Patient was weighing 177 pounds is down to 164 she states that is due to her exercise and eating healthcare. The following labs nonfasting were ordered: Comprehensive metabolic panel, TSH, screen cholesterol, CBC and urinalysis. We discussed importance of calcium and vitamin D for osteoporosis prevention. Patient history of anxiety takes Xanax when necessary     Ok Edwards MD, 8:51 AM 04/20/2013

## 2013-04-21 LAB — URINALYSIS W MICROSCOPIC + REFLEX CULTURE
Glucose, UA: NEGATIVE mg/dL
Hgb urine dipstick: NEGATIVE
Leukocytes, UA: NEGATIVE
Nitrite: NEGATIVE
Protein, ur: NEGATIVE mg/dL
pH: 5 (ref 5.0–8.0)

## 2013-05-13 ENCOUNTER — Ambulatory Visit
Admission: RE | Admit: 2013-05-13 | Discharge: 2013-05-13 | Disposition: A | Discharge status: Home / Self Care | Payer: 59 | Source: Ambulatory Visit

## 2013-05-13 DIAGNOSIS — Z1231 Encounter for screening mammogram for malignant neoplasm of breast: Secondary | ICD-10-CM

## 2013-05-18 ENCOUNTER — Other Ambulatory Visit: Payer: Self-pay | Admitting: Gynecology

## 2013-05-18 DIAGNOSIS — R928 Other abnormal and inconclusive findings on diagnostic imaging of breast: Secondary | ICD-10-CM

## 2013-05-19 ENCOUNTER — Telehealth: Payer: Self-pay | Admitting: *Deleted

## 2013-05-19 ENCOUNTER — Encounter: Payer: Self-pay | Admitting: *Deleted

## 2013-05-19 NOTE — Telephone Encounter (Signed)
Pt called with questions about regarding abnormal mammogram all questions answered, pt scheduled for diag. Mammogram. Pt also need a note stating she received a flu shot on 04/20/13. Letter will be written and faxed to patient.

## 2013-06-05 ENCOUNTER — Ambulatory Visit
Admission: RE | Admit: 2013-06-05 | Discharge: 2013-06-05 | Disposition: A | Discharge status: Home / Self Care | Payer: Managed Care, Other (non HMO) | Source: Ambulatory Visit | Attending: Gynecology | Admitting: Gynecology

## 2013-06-05 ENCOUNTER — Other Ambulatory Visit: Payer: Self-pay | Admitting: Gynecology

## 2013-06-05 DIAGNOSIS — R928 Other abnormal and inconclusive findings on diagnostic imaging of breast: Secondary | ICD-10-CM

## 2013-12-22 ENCOUNTER — Other Ambulatory Visit: Payer: Self-pay | Admitting: Gynecology

## 2013-12-22 DIAGNOSIS — R921 Mammographic calcification found on diagnostic imaging of breast: Secondary | ICD-10-CM

## 2013-12-31 ENCOUNTER — Ambulatory Visit
Admission: RE | Admit: 2013-12-31 | Discharge: 2013-12-31 | Disposition: A | Discharge status: Home / Self Care | Payer: Private Health Insurance - Indemnity | Source: Ambulatory Visit | Attending: Gynecology | Admitting: Gynecology

## 2013-12-31 ENCOUNTER — Encounter (INDEPENDENT_AMBULATORY_CARE_PROVIDER_SITE_OTHER): Payer: Self-pay

## 2013-12-31 DIAGNOSIS — R921 Mammographic calcification found on diagnostic imaging of breast: Secondary | ICD-10-CM

## 2014-05-20 ENCOUNTER — Encounter: Payer: Self-pay | Admitting: Gynecology

## 2014-05-20 ENCOUNTER — Ambulatory Visit (INDEPENDENT_AMBULATORY_CARE_PROVIDER_SITE_OTHER): Payer: Managed Care, Other (non HMO) | Admitting: Gynecology

## 2014-05-20 ENCOUNTER — Other Ambulatory Visit (HOSPITAL_COMMUNITY)
Admission: RE | Admit: 2014-05-20 | Discharge: 2014-05-20 | Disposition: A | Discharge status: Home / Self Care | Payer: Private Health Insurance - Indemnity | Source: Ambulatory Visit | Attending: Gynecology | Admitting: Gynecology

## 2014-05-20 VITALS — BP 126/88 | Ht 62.0 in | Wt 177.0 lb

## 2014-05-20 DIAGNOSIS — Z23 Encounter for immunization: Secondary | ICD-10-CM

## 2014-05-20 DIAGNOSIS — Z01411 Encounter for gynecological examination (general) (routine) with abnormal findings: Secondary | ICD-10-CM | POA: Insufficient documentation

## 2014-05-20 DIAGNOSIS — Z01419 Encounter for gynecological examination (general) (routine) without abnormal findings: Secondary | ICD-10-CM

## 2014-05-20 LAB — CBC WITH DIFFERENTIAL/PLATELET
BASOS ABS: 0 10*3/uL (ref 0.0–0.1)
BASOS PCT: 0 % (ref 0–1)
Eosinophils Absolute: 0.2 10*3/uL (ref 0.0–0.7)
Eosinophils Relative: 3 % (ref 0–5)
HCT: 38.8 % (ref 36.0–46.0)
HEMOGLOBIN: 13.7 g/dL (ref 12.0–15.0)
Lymphocytes Relative: 41 % (ref 12–46)
Lymphs Abs: 3 10*3/uL (ref 0.7–4.0)
MCH: 30.9 pg (ref 26.0–34.0)
MCHC: 35.3 g/dL (ref 30.0–36.0)
MCV: 87.4 fL (ref 78.0–100.0)
Monocytes Absolute: 0.6 10*3/uL (ref 0.1–1.0)
Monocytes Relative: 8 % (ref 3–12)
NEUTROS ABS: 3.6 10*3/uL (ref 1.7–7.7)
NEUTROS PCT: 48 % (ref 43–77)
Platelets: 312 10*3/uL (ref 150–400)
RBC: 4.44 MIL/uL (ref 3.87–5.11)
RDW: 13.1 % (ref 11.5–15.5)
WBC: 7.4 10*3/uL (ref 4.0–10.5)

## 2014-05-20 LAB — COMPREHENSIVE METABOLIC PANEL
ALBUMIN: 4.2 g/dL (ref 3.5–5.2)
ALK PHOS: 58 U/L (ref 39–117)
ALT: 13 U/L (ref 0–35)
AST: 11 U/L (ref 0–37)
BUN: 11 mg/dL (ref 6–23)
CO2: 25 mEq/L (ref 19–32)
Calcium: 8.7 mg/dL (ref 8.4–10.5)
Chloride: 103 mEq/L (ref 96–112)
Creat: 0.65 mg/dL (ref 0.50–1.10)
Glucose, Bld: 80 mg/dL (ref 70–99)
POTASSIUM: 3.9 meq/L (ref 3.5–5.3)
SODIUM: 138 meq/L (ref 135–145)
TOTAL PROTEIN: 6.6 g/dL (ref 6.0–8.3)
Total Bilirubin: 0.4 mg/dL (ref 0.2–1.2)

## 2014-05-20 LAB — TSH: TSH: 2.825 u[IU]/mL (ref 0.350–4.500)

## 2014-05-20 LAB — CHOLESTEROL, TOTAL: Cholesterol: 154 mg/dL (ref 0–200)

## 2014-05-20 NOTE — Patient Instructions (Signed)

## 2014-05-20 NOTE — Addendum Note (Signed)
Addended by: Berna SpareASTILLO, Talita Recht A on: 05/20/2014 04:08 PM   Modules accepted: Orders

## 2014-05-20 NOTE — Progress Notes (Signed)
Kimberly Mcmahon 12/03/1965 [id number]   History:    48 y.o.  for annual gyn exam with no complaints today. 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  Mammogram report from June of this year as follows:  IMPRESSION:  No significant interval change in the previously identified probably  benign microcalcifications in the central left breast.  RECOMMENDATION:  Recommend bilateral diagnostic mammography in 6 months to continue  the short-term follow-up imaging of these probably benign  calcifications in the left breast and to maintain an annual interval  for the right breast.  Patient suffers from dysmenorrhea and takes ibuprofen 800 mg 3 times a day when necessary. Patient's flu vaccine is up-to-date. Patient will receive her T that vaccine today. She has not seen her PCP in a year has not had any blood work.  Past medical history,surgical history, family history and social history were all reviewed and documented in the EPIC chart.  Gynecologic History Patient's last menstrual period was 04/29/2014. Contraception: tubal ligation Last Pap: 2012 and 2013. Results were: abnormal Last mammogram: See above. Results were: See above  Obstetric History OB History  Gravida Para Term Preterm AB SAB TAB Ectopic Multiple Living  4 4 4       4     # Outcome Date GA Lbr Len/2nd Weight Sex Delivery Anes PTL Lv  4 TRM     F SVD  N Y  3 TRM     M SVD  N Y  2 TRM     M SVD  N Y  1 TRM     M SVD  N Y       ROS: A ROS was performed and pertinent positives and negatives are included in the history.  GENERAL: No fevers or chills. HEENT: No change in vision, no earache, sore throat or sinus congestion. NECK: No pain or  stiffness. CARDIOVASCULAR: No chest pain or pressure. No palpitations. PULMONARY: No shortness of breath, cough or wheeze. GASTROINTESTINAL: No abdominal pain, nausea, vomiting or diarrhea, melena or bright red blood per rectum. GENITOURINARY: No urinary frequency, urgency, hesitancy or dysuria. MUSCULOSKELETAL: No joint or muscle pain, no back pain, no recent trauma. DERMATOLOGIC: No rash, no itching, no lesions. ENDOCRINE: No polyuria, polydipsia, no heat or cold intolerance. No recent change in weight. HEMATOLOGICAL: No anemia or easy bruising or bleeding. NEUROLOGIC: No headache, seizures, numbness, tingling or weakness. PSYCHIATRIC: No depression, no loss of interest in normal activity or change in sleep pattern.     Exam: chaperone present  BP 126/88  Ht 5\' 2"  (1.575 m)  Wt 177 lb (80.287 kg)  BMI 32.37 kg/m2  LMP 04/29/2014  Body mass index is 32.37 kg/(m^2).  General appearance : Well developed well nourished female. No acute distress HEENT: Neck supple, trachea midline, no carotid bruits, no thyroidmegaly Lungs: Clear to auscultation, no rhonchi or wheezes, or rib retractions  Heart: Regular rate and rhythm, no murmurs or gallops Breast:Examined in sitting and supine position were symmetrical in appearance, no palpable masses or tenderness,  no skin retraction, no nipple inversion, no nipple discharge, no skin discoloration, no axillary or supraclavicular lymphadenopathy Abdomen: no palpable masses or tenderness, no rebound or guarding Extremities: no edema or skin discoloration or  tenderness  Pelvic:  Bartholin, Urethra, Skene Glands: Within normal limits             Vagina: No gross lesions or discharge  Cervix: Leukoplakic areas of the cervix and Pap smear was obtained but then after             passing the Q-tip it cleared  Uterus  anteverted, normal size, shape and consistency, non-tender and mobile  Adnexa  Without masses or tenderness  Anus and perineum  normal    Rectovaginal  normal sphincter tone without palpated masses or tenderness             Hemoccult not indicated     Assessment/Plan:  48 y.o. female for annual exam who had a Pap smear done today. Pap smear was done 2 years ago was normal. The following labs were ordered: CBC, conference metabolic panel, TSH, screening cholesterol and urinalysis. Patient received the Inetta Fermoina vaccine today. Patient to follow for mammogram in December. We discussed importance of calcium and vitamin D and regular exercise for osteoporosis prevention.   Ok EdwardsFERNANDEZ,Demetruis Depaul H MD, 3:45 PM 05/20/2014

## 2014-05-21 LAB — URINALYSIS W MICROSCOPIC + REFLEX CULTURE
Bacteria, UA: NONE SEEN
Bilirubin Urine: NEGATIVE
CASTS: NONE SEEN
CRYSTALS: NONE SEEN
GLUCOSE, UA: NEGATIVE mg/dL
HGB URINE DIPSTICK: NEGATIVE
KETONES UR: NEGATIVE mg/dL
Nitrite: NEGATIVE
PH: 6.5 (ref 5.0–8.0)
Protein, ur: NEGATIVE mg/dL
Specific Gravity, Urine: 1.021 (ref 1.005–1.030)
Urobilinogen, UA: 0.2 mg/dL (ref 0.0–1.0)

## 2014-05-22 LAB — URINE CULTURE: Colony Count: 9000

## 2014-05-24 ENCOUNTER — Encounter: Payer: Self-pay | Admitting: Gynecology

## 2014-05-24 ENCOUNTER — Telehealth: Payer: Self-pay | Admitting: *Deleted

## 2014-05-24 LAB — CYTOLOGY - PAP

## 2014-05-24 MED ORDER — IBUPROFEN 800 MG PO TABS: 800.0000 mg | ORAL_TABLET | Freq: Three times a day (TID) | ORAL | Status: DC | PRN

## 2014-05-24 NOTE — Telephone Encounter (Signed)
Pt was prescribed motrin 600 mg asked if it could be increased to motrin 800 mg. Pt said you gave this to her last year for lower back pain she was having. No back pain now, but she was using motrin for headaches as well when regular OTC was not working. Okay for pt to have Motrin 800mg .? Please advise

## 2014-05-24 NOTE — Telephone Encounter (Signed)
Motrin 800 mg TID PRN #30 refill x 3

## 2014-05-24 NOTE — Telephone Encounter (Signed)
Rx sent, pt informed. 

## 2014-09-01 ENCOUNTER — Other Ambulatory Visit: Payer: Self-pay | Admitting: Gynecology

## 2014-09-01 DIAGNOSIS — R921 Mammographic calcification found on diagnostic imaging of breast: Secondary | ICD-10-CM

## 2014-09-09 ENCOUNTER — Ambulatory Visit
Admission: RE | Admit: 2014-09-09 | Discharge: 2014-09-09 | Disposition: A | Discharge status: Home / Self Care | Payer: Managed Care, Other (non HMO) | Source: Ambulatory Visit | Attending: Gynecology | Admitting: Gynecology

## 2014-09-09 DIAGNOSIS — R921 Mammographic calcification found on diagnostic imaging of breast: Secondary | ICD-10-CM

## 2015-09-08 ENCOUNTER — Other Ambulatory Visit: Payer: Self-pay | Admitting: Gynecology

## 2015-09-08 DIAGNOSIS — R921 Mammographic calcification found on diagnostic imaging of breast: Secondary | ICD-10-CM

## 2015-09-13 ENCOUNTER — Telehealth: Payer: Self-pay | Admitting: *Deleted

## 2015-09-13 NOTE — Telephone Encounter (Signed)
Pt called c/o itchy breasts on both and also area on her breast that appears to be small bumps. Pt seemed concerned about this area, I advised her to schedule OV with JF for breast exam

## 2015-09-15 ENCOUNTER — Ambulatory Visit (INDEPENDENT_AMBULATORY_CARE_PROVIDER_SITE_OTHER): Payer: Managed Care, Other (non HMO) | Admitting: Gynecology

## 2015-09-15 ENCOUNTER — Encounter: Payer: Self-pay | Admitting: Gynecology

## 2015-09-15 VITALS — BP 136/88

## 2015-09-15 DIAGNOSIS — Z8742 Personal history of other diseases of the female genital tract: Secondary | ICD-10-CM | POA: Insufficient documentation

## 2015-09-15 DIAGNOSIS — N6001 Solitary cyst of right breast: Secondary | ICD-10-CM

## 2015-09-15 DIAGNOSIS — N6002 Solitary cyst of left breast: Secondary | ICD-10-CM | POA: Diagnosis not present

## 2015-09-15 DIAGNOSIS — L299 Pruritus, unspecified: Secondary | ICD-10-CM | POA: Diagnosis not present

## 2015-09-15 MED ORDER — NYSTATIN-TRIAMCINOLONE 100000-0.1 UNIT/GM-% EX CREA: 1.0000 "application " | TOPICAL_CREAM | Freq: Three times a day (TID) | CUTANEOUS | Status: DC

## 2015-09-15 NOTE — Progress Notes (Signed)
   Patient is a 50 year old who has not been seen in the office since 2015 who presented due to the fact for the past several week she's complained bilateral breast pruritus. No palpable masses no nipple discharge. 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  Mammogram report from June of 2015 as follows:  IMPRESSION:  No significant interval change in the previously identified probably  benign microcalcifications in the central left breast.  RECOMMENDATION:  Recommend bilateral diagnostic mammography in 6 months to continue  the short-term follow-up imaging of these probably benign  calcifications in the left breast and to maintain an annual interval [place] tubal ligation.  Patient returned in February 2016 and the following was reported:  CLINICAL DATA: Six-month follow-up calcifications left breast  EXAM: DIGITAL DIAGNOSTIC BILATERAL MAMMOGRAM WITH CAD  COMPARISON: January 22, 2011, February 13, 2012, May 13, 2013, December 31, 2013  ACR Breast Density Category b: There are scattered areas of fibroglandular density.  FINDINGS: Cc and MLO views of bilateral breasts, spot magnification cc and lateral views of the left breast are submitted. Stable calcifications are identified within the left breast. Suspicious abnormality is identified within the right breast.  Mammographic images were processed with CAD.  IMPRESSION: Probable benign findings.  RECOMMENDATION: Six month followup mammogram left breast  Patient did not return for follow-up mammogram in September of last year was recommended and she has now scheduled that for next week.  Exam: Both breasts were examined sitting supine position both breasts are  symmetrical in appearance no nipple inversion no palpable masses or tenderness no supraclavicular axillary lymphadenopathy on either breast. Slight redness superficially on both breasts outer quadrants.  Assessment/plan: Patient will be given a trial of mytrex cream to help with her breast pruritus. She'll be instructed to restrict bra support for the next 3-4 days. Change detergent perhaps an bra style. Patient to follow-up for the recommended mammogram next week because of the above-mentioned findings. Patient to schedule her overdue annual exam here with [place] in the office for March or April.

## 2015-09-15 NOTE — Patient Instructions (Signed)
Nystatin; Triamcinolone cream or ointment What is this medicine? NYSTATIN; TRIAMCINOLONE (nye STAT in; trye am SIN [place]) is a combination of an antifungal medicine and a steroid. It is used to treat certain kinds of fungal or yeast infections of the skin. This medicine may be used for other purposes; ask your health care provider or pharmacist if you have questions. What should I tell my health care provider before I take this medicine? They need to know if you have any of these conditions: -large areas of burned or damaged skin -skin wasting or thinning -peripheral vascular disease or poor circulation -an unusual or allergic reaction to nystatin, triamcinolone, other corticosteroids, other medicines, foods, dyes, or preservatives -pregnant or trying to get pregnant -breast-feeding How should I use this medicine? This medicine is for external use only. Do not take by mouth. Follow the directions on the prescription label. Wash your hands before and after use. If treating hand or nail infections, wash hands before use only. Apply a thin layer of this medicine to the affected area and rub in gently. Do not use on healthy skin or over large areas of skin. Do not get this medicine in your eyes. If you do, rinse out with plenty of cool tap water. When applying to the groin area, apply a limited amount and do not use for longer than 2 weeks unless directed to by your doctor or health care professional. Do not cover or wrap the treated area with an airtight bandage (such as a plastic bandage). Use the full course of treatment prescribed, even if you think the infection is getting better. Use at regular intervals. Do not use your medicine more often than directed. Do not use this medicine for any condition other than the one for which it was prescribed. Talk to your pediatrician regarding the use of this medicine in children. While this drug may be prescribed for selected conditions, precautions do apply.  Children being treated in the diaper area should not wear tight-fitting diapers or plastic pants. Elderly patients are more likely to have damaged skin through aging, and this may increase side effects. This medicine should only be used for brief periods and infrequently in older patients. Overdosage: If you think you have taken too much of this medicine contact a poison control center or emergency room at once. NOTE: This medicine is only for you. Do not share this medicine with others. What if I miss a dose? If you miss a dose, use it as soon as you can. If it is almost time for your next dose, use only that dose. Do not use double or extra doses. What may interact with this medicine? Interactions are not expected. Do not use any other skin products on the affected area without telling your doctor or health care professional. This list may not describe all possible interactions. Give your health care provider a list of all the medicines, herbs, non-prescription drugs, or dietary supplements you use. Also tell them if you smoke, drink alcohol, or use illegal drugs. Some items may interact with your medicine. What should I watch for while using this medicine? Tell your doctor or health care professional if your symptoms do not start to get better within 1 week when treating the groin area or within 2 weeks when treating the feet. . Tell your doctor or health care professional if you develop sores or blisters that do not heal properly. If your skin infection returns after stopping this medicine, contact your doctor   or health care professional. If you are using this medicine to treat an infection in the groin area, do not wear underwear that is tight-fitting or made from synthetic fibers such as rayon or nylon. Instead, wear loose-fitting, cotton underwear. Also dry the area completely after bathing. What side effects may I notice from receiving this medicine? Side effects that you should report to your  doctor or health care professional as soon as possible: -burning or itching of the skin -dark red spots on the skin -loss of feeling on skin -painful, red, pus-filled blisters in hair follicles -skin infection -thinning of the skin or sunburn: more likely if applied to the face Side effects that usually do not require medical attention (report to your doctor or health care professional if they continue or are bothersome): -dry or peeling skin -skin irritation This list may not describe all possible side effects. Call your doctor for medical advice about side effects. You may report side effects to FDA at 1-800-FDA-1088. Where should I keep my medicine? Keep out of the reach of children. Store at room temperature between 15 and 30 degrees C (59 and 86 degrees F). Do not freeze. Throw away any unused medicine after the expiration date. NOTE: This sheet is a summary. It may not cover all possible information. If you have questions about this medicine, talk to your doctor, pharmacist, or health care provider.    2016, [employer] Standard. (2008-01-30 17:29:26)  

## 2015-09-20 ENCOUNTER — Ambulatory Visit
Admission: RE | Admit: 2015-09-20 | Discharge: 2015-09-20 | Disposition: A | Discharge status: Home / Self Care | Payer: Managed Care, Other (non HMO) | Source: Ambulatory Visit | Attending: Gynecology | Admitting: Gynecology

## 2015-09-20 DIAGNOSIS — R921 Mammographic calcification found on diagnostic imaging of breast: Secondary | ICD-10-CM

## 2015-10-12 ENCOUNTER — Encounter: Payer: Managed Care, Other (non HMO) | Admitting: Gynecology

## 2015-10-17 ENCOUNTER — Telehealth: Payer: Self-pay | Admitting: *Deleted

## 2015-10-17 NOTE — Telephone Encounter (Signed)
Pt called c/o no cycle this month, LMP: feb, pt had tubal ligation , asked if she could be in menopause. I told pt I have no way of telling her this is menopause, that OV best with provider. Transferred to appointments.

## 2015-11-30 ENCOUNTER — Ambulatory Visit (INDEPENDENT_AMBULATORY_CARE_PROVIDER_SITE_OTHER): Payer: Managed Care, Other (non HMO) | Admitting: Gynecology

## 2015-11-30 ENCOUNTER — Encounter: Payer: Self-pay | Admitting: Gynecology

## 2015-11-30 VITALS — BP 130/80 | Ht 63.0 in | Wt 178.0 lb

## 2015-11-30 DIAGNOSIS — N951 Menopausal and female climacteric states: Secondary | ICD-10-CM

## 2015-11-30 DIAGNOSIS — Z01419 Encounter for gynecological examination (general) (routine) without abnormal findings: Secondary | ICD-10-CM

## 2015-11-30 NOTE — Patient Instructions (Addendum)
Perimenopause Perimenopause is the time when your body begins to move into the menopause (no menstrual period for 12 straight months). It is a natural process. Perimenopause can begin 2-8 years before the menopause and usually lasts for 1 year after the menopause. During this time, your ovaries may or may not produce an egg. The ovaries vary in their production of estrogen and progesterone hormones each month. This can cause irregular menstrual periods, difficulty getting pregnant, vaginal bleeding between periods, and uncomfortable symptoms. CAUSES  Irregular production of the ovarian hormones, estrogen and progesterone, and not ovulating every month.  Other causes include:  Tumor of the pituitary gland in the brain.  Medical disease that affects the ovaries.  Radiation treatment.  Chemotherapy.  Unknown causes.  Heavy smoking and excessive alcohol intake can bring on perimenopause sooner. SIGNS AND SYMPTOMS   Hot flashes.  Night sweats.  Irregular menstrual periods.  Decreased sex drive.  Vaginal dryness.  Headaches.  Mood swings.  Depression.  Memory problems.  Irritability.  Tiredness.  Weight gain.  Trouble getting pregnant.  The beginning of losing bone cells (osteoporosis).  The beginning of hardening of the arteries (atherosclerosis). DIAGNOSIS  Your health care provider will make a diagnosis by analyzing your age, menstrual history, and symptoms. He or she will do a physical exam and note any changes in your body, especially your female organs. Female hormone tests may or may not be helpful depending on the amount of female hormones you produce and when you produce them. However, other hormone tests may be helpful to rule out other problems. TREATMENT  In some cases, no treatment is needed. The decision on whether treatment is necessary during the perimenopause should be made by you and your health care provider based on how the symptoms are affecting you  and your lifestyle. Various treatments are available, such as:  Treating individual symptoms with a specific medicine for that symptom.  Herbal medicines that can help specific symptoms.  Counseling.  Group therapy. HOME CARE INSTRUCTIONS   Keep track of your menstrual periods (when they occur, how heavy they are, how long between periods, and how long they last) as well as your symptoms and when they started.  Only take over-the-counter or prescription medicines as directed by your health care provider.  Sleep and rest.  Exercise.  Eat a diet that contains calcium (good for your bones) and soy (acts like the estrogen hormone).  Do not smoke.  Avoid alcoholic beverages.  Take vitamin supplements as recommended by your health care provider. Taking vitamin E may help in certain cases.  Take calcium and vitamin D supplements to help prevent bone loss.  Group therapy is sometimes helpful.  Acupuncture may help in some cases. SEEK MEDICAL CARE IF:   You have questions about any symptoms you are having.  You need a referral to a specialist (gynecologist, psychiatrist, or psychologist). SEEK IMMEDIATE MEDICAL CARE IF:   You have vaginal bleeding.  Your period lasts longer than 8 days.  Your periods are recurring sooner than 21 days.  You have bleeding after intercourse.  You have severe depression.  You have pain when you urinate.  You have severe headaches.  You have vision problems.   This information is not intended to replace advice given to you by your health care provider. Make sure you discuss any questions you have with your health care provider.   Document Released: 08/16/2004 Document Revised: 07/30/2014 Document Reviewed: 02/05/2013 Elsevier Interactive Patient Education [employer]2016 [employer] Inc.  Patient information: High cholesterol (The Basics)  What is cholesterol? - Cholesterol is a substance that is found in  the blood. Everyone has some. It is needed for good health. The problem is, people sometimes have too much cholesterol. Compared with people with normal cholesterol, people with high cholesterol have a higher risk of heart attacks, strokes, and other health problems. The higher your cholesterol, the higher your risk of these problems. Cholesterol levels in your body are determined significantly by your diet. Cholesterol levels may also be related to heart disease. The following material helps to explain this relationship and discusses what you can do to help keep your heart healthy. Not all cholesterol is bad. Low-density lipoprotein (LDL) cholesterol is the "bad" cholesterol. It may cause fatty deposits to build up inside your arteries. High-density lipoprotein (HDL) cholesterol is "good." It helps to remove the "bad" LDL cholesterol from your blood. Cholesterol is a very important risk factor for heart disease. Other risk factors are high blood pressure, smoking, stress, heredity, and weight.  The heart muscle gets its supply of blood through the coronary arteries. If your LDL cholesterol is high and your HDL cholesterol is low, you are at risk for having fatty deposits build up in your coronary arteries. This leaves less room through which blood can flow. Without sufficient blood and oxygen, the heart muscle cannot function properly and you may feel chest pains (angina pectoris). When a coronary artery closes up entirely, a part of the heart muscle may die, causing a heart attack (myocardial infarction).  CHECKING CHOLESTEROL When your caregiver sends your blood to a lab to be analyzed for cholesterol, a complete lipid (fat) profile may be done. With this test, the total amount of cholesterol and levels of LDL and HDL are determined. Triglycerides are a type of fat that circulates in the blood and can also be used to determine heart disease risk. Are there different types of cholesterol? - Yes, there are a  few different types. If you get a cholesterol test, you may hear your doctor or nurse talk about: Total cholesterol  LDL cholesterol - Some people call this the "bad" cholesterol. That's because having high LDL levels raises your risk of heart attacks, strokes, and other health problems.  HDL cholesterol - Some people call this the "good" cholesterol. That's because having high HDL levels lowers your risk of heart attacks, strokes, and other health problems.  Non-HDL cholesterol - Non-HDL cholesterol is your total cholesterol minus your HDL cholesterol.  Triglycerides - Triglycerides are not cholesterol. They are a type of fat. But they often get measured when cholesterol is measured. (Having high triglycerides also seems to increase the risk of heart attacks and strokes.)   Keep in mind, though, that many people who cannot meet these goals still have a low risk of heart attacks and strokes. What should I do if my doctor tells me I have high cholesterol? - Ask your doctor what your overall risk of heart attacks and strokes is. High cholesterol, by itself, is not always a reason to worry. Having high cholesterol is just one of many things that can increase your risk of heart attacks and strokes. Other factors that increase your risk include:  Cigarette smoking  High blood pressure  Having a parent, sister, or brother who got heart disease at a young age [place], in this case, means younger than 59 for men and younger than 74 for women.)  Being a man (Women are at risk, too, but  men have a higher risk.)  Older age  If you are at high risk of heart attacks and strokes, having high cholesterol is a problem. On the other hand, if you have are at low risk, having high cholesterol may not mean much. Should I take medicine to lower cholesterol? - Not everyone who has high cholesterol needs medicines. Your doctor or nurse will decide if you need them based on your age, family history, and other health concerns.   You should probably take a cholesterol-lowering medicine called a statin if you: Already had a heart attack or stroke  Have known heart disease  Have diabetes  Have a condition called peripheral artery disease, which makes it painful to walk, and happens when the arteries in your legs get clogged with fatty deposits  Have an abdominal aortic aneurysm, which is a widening of the main artery in the belly  Most people with any of the conditions listed above should take a statin no matter what their cholesterol level is. If your doctor or nurse puts you on a statin, stay on it. The medicine may not make you feel any different. But it can help prevent heart attacks, strokes, and death.  Can I lower my cholesterol without medicines? - Yes, you can lower your cholesterol some by:  Avoiding red meat, butter, fried foods, cheese, and other foods that have a lot of saturated fat  Losing weight (if you are overweight)  Being more active Even if these steps do little to change your cholesterol, they can improve your health in many ways.                                                   Cholesterol Control Diet  CONTROLLING CHOLESTEROL WITH DIET Although exercise and lifestyle factors are important, your diet is key. That is because certain foods are known to raise cholesterol and others to lower it. The goal is to balance foods for their effect on cholesterol and more importantly, to replace saturated and trans fat with other types of fat, such as monounsaturated fat, polyunsaturated fat, and omega-3 fatty acids. On average, a person should consume no more than 15 to 17 g of saturated fat daily. Saturated and trans fats are considered "bad" fats, and they will raise LDL cholesterol. Saturated fats are primarily found in animal products such as meats, butter, and cream. However, that does not mean you need to sacrifice all your favorite foods. Today, there are good tasting, low-fat, low-cholesterol substitutes  for most of the things you like to eat. Choose low-fat or nonfat alternatives. Choose round or loin cuts of red meat, since these types of cuts are lowest in fat and cholesterol. Chicken (without the skin), fish, veal, and ground [place] breast are excellent choices. Eliminate fatty meats, such as hot dogs and salami. Even shellfish have little or no saturated fat. Have a 3 oz (85 g) portion when you eat lean meat, poultry, or fish. Trans fats are also called "partially hydrogenated oils." They are oils that have been scientifically manipulated so that they are solid at room temperature resulting in a longer shelf life and improved taste and texture of foods in which they are added. Trans fats are found in stick margarine, some tub margarines, cookies, crackers, and baked goods.  When baking and cooking, oils are an excellent  substitute for butter. The monounsaturated oils are especially beneficial since it is believed they lower LDL and raise HDL. The oils you should avoid entirely are saturated tropical oils, such as coconut and palm.  Remember to eat liberally from food groups that are naturally free of saturated and trans fat, including fish, fruit, vegetables, beans, grains (barley, rice, couscous, bulgur wheat), and pasta (without cream sauces).  IDENTIFYING FOODS THAT LOWER CHOLESTEROL  Soluble fiber may lower your cholesterol. This type of fiber is found in fruits such as apples, vegetables such as broccoli, potatoes, and carrots, legumes such as beans, peas, and lentils, and grains such as barley. Foods fortified with plant sterols (phytosterol) may also lower cholesterol. You should eat at least 2 g per day of these foods for a cholesterol lowering effect.  Read package labels to identify low-saturated fats, trans fats free, and low-fat foods at the supermarket. Select cheeses that have only 2 to 3 g saturated fat per ounce. Use a heart-healthy tub margarine that is free of trans fats or partially  hydrogenated oil. When buying baked goods (cookies, crackers), avoid partially hydrogenated oils. Breads and muffins should be made from whole grains (whole-wheat or whole oat flour, instead of "flour" or "enriched flour"). Buy non-creamy canned soups with reduced salt and no added fats.  FOOD PREPARATION TECHNIQUES  Never deep-fry. If you must fry, either stir-fry, which uses very little fat, or use non-stick cooking sprays. When possible, broil, bake, or roast meats, and steam vegetables. Instead of dressing vegetables with butter or margarine, use lemon and herbs, applesauce and cinnamon (for squash and sweet potatoes), nonfat yogurt, salsa, and low-fat dressings for salads.  LOW-SATURATED FAT / LOW-FAT FOOD SUBSTITUTES Meats / Saturated Fat (g)  Avoid: Steak, marbled (3 oz/85 g) / 11 g   Choose: Steak, lean (3 oz/85 g) / 4 g   Avoid: Hamburger (3 oz/85 g) / 7 g   Choose: Hamburger, lean (3 oz/85 g) / 5 g   Avoid: Ham (3 oz/85 g) / 6 g   Choose: Ham, lean cut (3 oz/85 g) / 2.4 g   Avoid: Chicken, with skin, dark meat (3 oz/85 g) / 4 g   Choose: Chicken, skin removed, dark meat (3 oz/85 g) / 2 g   Avoid: Chicken, with skin, light meat (3 oz/85 g) / 2.5 g   Choose: Chicken, skin removed, light meat (3 oz/85 g) / 1 g  Dairy / Saturated Fat (g)  Avoid: Whole milk (1 cup) / 5 g   Choose: Low-fat milk, 2% (1 cup) / 3 g   Choose: Low-fat milk, 1% (1 cup) / 1.5 g   Choose: Skim milk (1 cup) / 0.3 g   Avoid: Hard cheese (1 oz/28 g) / 6 g   Choose: Skim milk cheese (1 oz/28 g) / 2 to 3 g   Avoid: Cottage cheese, 4% fat (1 cup) / 6.5 g   Choose: Low-fat cottage cheese, 1% fat (1 cup) / 1.5 g   Avoid: Ice cream (1 cup) / 9 g   Choose: Sherbet (1 cup) / 2.5 g   Choose: Nonfat frozen yogurt (1 cup) / 0.3 g   Choose: Frozen fruit bar / trace   Avoid: Whipped cream (1 tbs) / 3.5 g   Choose: Nondairy whipped topping (1 tbs) / 1 g  Condiments / Saturated Fat (g)  Avoid:  Mayonnaise (1 tbs) / 2 g   Choose: Low-fat mayonnaise (1 tbs) / 1 g  Avoid: Butter (1 tbs) / 7 g   Choose: Extra light margarine (1 tbs) / 1 g   Avoid: Coconut oil (1 tbs) / 11.8 g   Choose: Olive oil (1 tbs) / 1.8 g   Choose: Corn oil (1 tbs) / 1.7 g   Choose: Safflower oil (1 tbs) / 1.2 g   Choose: Sunflower oil (1 tbs) / 1.4 g   Choose: Soybean oil (1 tbs) / 2.4 g   Choose: Canola oil (1 tbs) / 1 g  Exercise to Lose Weight Exercise and a healthy diet may help you lose weight. Your doctor may suggest specific exercises. EXERCISE IDEAS AND TIPS Choose low-cost things you enjoy doing, such as walking, bicycling, or exercising to workout videos.  Take stairs instead of the elevator.  Walk during your lunch break.  Park your car further away from work or school.  Go to a gym or an exercise class.  Start with 5 to 10 minutes of exercise each day. Build up to 30 minutes of exercise 4 to 6 days a week.  Wear shoes with good support and comfortable clothes.  Stretch before and after working out.  Work out until you breathe harder and your heart beats faster.  Drink extra water when you exercise.  Do not do so much that you hurt yourself, feel dizzy, or get very short of breath.  Exercises that burn about 150 calories: Running 1  miles in 15 minutes.  Playing volleyball for 45 to 60 minutes.  Washing and waxing a car for 45 to 60 minutes.  Playing touch football for 45 minutes.  Walking 1  miles in 35 minutes.  Pushing a stroller 1  miles in 30 minutes.  Playing basketball for 30 minutes.  Raking leaves for 30 minutes.  Bicycling 5 miles in 30 minutes.  Walking 2 miles in 30 minutes.  Dancing for 30 minutes.  Shoveling snow for 15 minutes.  Swimming laps for 20 minutes.  Walking up stairs for 15 minutes.  Bicycling 4 miles in 15 minutes.  Gardening for 30 to 45 minutes.  Jumping rope for 15 minutes.  Washing windows or floors for 45 to 60 minutes.  Document  Released: 08/11/2010 Document Revised: 03/21/2011 Document Reviewed: 08/11/2010 [facility] Patient Information 2012 [place], [place].

## 2015-11-30 NOTE — Progress Notes (Signed)
Kimberly Mcmahon 03/27/1966 [id number]   History:    50 y.o.  for annual gyn exam with no complaints today. Patient did state that in the month of March she had no menstrual cycle and sometimes she will be late for a few days. She has very infrequent of any vasomotor symptoms at this time.Mammogram report from June of 2015 as follows:  IMPRESSION:  No significant interval change in the previously identified probably  benign microcalcifications in the central left breast.  RECOMMENDATION:  Recommend bilateral diagnostic mammography in 6 months to continue  the short-term follow-up imaging of these probably benign  calcifications in the left breast and to maintain an annual interval us tubal ligation.  Patient returned in February 2016 and the following was reported:  CLINICAL DATA: Six-month follow-up calcifications left breast  EXAM: DIGITAL DIAGNOSTIC BILATERAL MAMMOGRAM WITH CAD  COMPARISON: January 22, 2011, February 13, 2012, May 13, 2013, December 31, 2013  ACR Breast Density Category b: There are scattered areas of fibroglandular density.  FINDINGS: Cc and MLO views of bilateral breasts, spot magnification cc and lateral views of the left breast are submitted. Stable calcifications are identified within the left breast. Suspicious abnormality is identified within the right breast.  Mammographic images were processed with CAD.  IMPRESSION: Probable benign findings. 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 Her follow-up mammogram in February 2017 demonstrated stable benign calcification of the left breast and recommended follow-up mammogram in 1 year three-dimensional  Patient stated that back in     Past medical history,surgical history, family history and  social history were all reviewed and documented in the EPIC chart.  Gynecologic History Patient's last menstrual period was 11/21/2015. Contraception: tubal ligation Last Pap: 2012, 2013, in 2015. Results were: normal Last mammogram: See above. Results were: See above  Obstetric History OB History  Gravida Para Term Preterm AB SAB TAB Ectopic Multiple Living  4 4 4       4     # Outcome Date GA Lbr Len/2nd Weight Sex Delivery Anes PTL Lv  4 Term     F Vag-Spont  N Y  3 Term     M Vag-Spont  N Y  2 Term     M Vag-Spont  N Y  1 Term     M Vag-Spont  N Y       ROS: A ROS was performed and pertinent positives and negatives are included in the history.  GENERAL: No fevers or chills. HEENT: No change in vision, no earache, sore throat or sinus congestion. NECK: No pain or stiffness. CARDIOVASCULAR: No chest pain or pressure. No palpitations. PULMONARY: No shortness of breath, cough or wheeze. GASTROINTESTINAL: No abdominal pain, nausea, vomiting or diarrhea, melena or bright red blood per rectum. GENITOURINARY: No urinary frequency, urgency, hesitancy or dysuria. MUSCULOSKELETAL: No joint or muscle pain, no back pain, no recent trauma. DERMATOLOGIC: No rash, no itching, no lesions. ENDOCRINE: No polyuria, polydipsia, no heat or cold intolerance. No recent change in weight. HEMATOLOGICAL: No anemia or easy bruising or bleeding. NEUROLOGIC: No headache, seizures, numbness, tingling or weakness. PSYCHIATRIC: No depression, no loss of interest in normal activity or change in sleep pattern.     Exam: chaperone present  BP 130/80 mmHg  Ht 5\' 3"  (1.6 m)  Wt 178 lb (80.74 kg)  BMI 31.54 kg/m2  LMP 11/21/2015  Body mass index is 31.54 kg/(m^2).  General appearance : Well developed well nourished female. No acute distress HEENT: Eyes: no retinal hemorrhage or exudates,  Neck supple, trachea midline, no carotid bruits, no thyroidmegaly Lungs: Clear to auscultation, no rhonchi or wheezes, or rib  retractions  Heart: Regular rate and rhythm, no murmurs or gallops Breast:Examined in sitting and supine position were symmetrical in appearance, no palpable masses or tenderness,  no skin retraction, no nipple inversion, no nipple discharge, no skin discoloration, no axillary or supraclavicular lymphadenopathy Abdomen: no palpable masses or tenderness, no rebound or guarding Extremities: no edema or skin discoloration or tenderness  Pelvic:  Bartholin, Urethra, Skene Glands: Within normal limits             Vagina: No gross lesions or discharge  Cervix: No gross lesions or discharge  Uterus  anteverted, normal size, shape and consistency, non-tender and mobile  Adnexa  Without masses or tenderness  Anus and perineum  normal   Rectovaginal  normal sphincter tone without palpated masses or tenderness             Hemoccult not indicated     Assessment/Plan:  50 y.o. female for annual exam who appears to be perimenopausal for this reason when she returns back to the office for fasting blood work next week we'll check an [facility]. Her screening blood work will consist of the following: Comprehensive metabolic panel, TSH, CBC, urinalysis and fasting lipid profile. Patient was reminded of the importance of monthly breast exam were discussed importance of regular exercise and proper nutrition for which literature information was provided. Literature information on the perimenopause was provided as well.   Ok Edwards MD, 5:08 PM 11/30/2015

## 2015-12-01 LAB — URINALYSIS W MICROSCOPIC + REFLEX CULTURE
BACTERIA UA: NONE SEEN [HPF]
Bilirubin Urine: NEGATIVE
Casts: NONE SEEN [LPF]
Crystals: NONE SEEN [HPF]
GLUCOSE, UA: NEGATIVE
Hgb urine dipstick: NEGATIVE
Ketones, ur: NEGATIVE
LEUKOCYTES UA: NEGATIVE
Nitrite: NEGATIVE
PROTEIN: NEGATIVE
SPECIFIC GRAVITY, URINE: 1.022 (ref 1.001–1.035)
YEAST: NONE SEEN [HPF]
pH: 5.5 (ref 5.0–8.0)

## 2015-12-02 LAB — URINE CULTURE
Colony Count: NO GROWTH
Organism ID, Bacteria: NO GROWTH

## 2015-12-06 ENCOUNTER — Other Ambulatory Visit: Payer: Managed Care, Other (non HMO)

## 2015-12-06 LAB — COMPREHENSIVE METABOLIC PANEL
ALBUMIN: 4 g/dL (ref 3.6–5.1)
ALK PHOS: 56 U/L (ref 33–115)
ALT: 14 U/L (ref 6–29)
AST: 11 U/L (ref 10–35)
BILIRUBIN TOTAL: 0.3 mg/dL (ref 0.2–1.2)
BUN: 15 mg/dL (ref 7–25)
CALCIUM: 9.2 mg/dL (ref 8.6–10.2)
CO2: 25 mmol/L (ref 20–31)
Chloride: 103 mmol/L (ref 98–110)
Creat: 0.74 mg/dL (ref 0.50–1.10)
GLUCOSE: 94 mg/dL (ref 65–99)
Potassium: 4.3 mmol/L (ref 3.5–5.3)
Sodium: 138 mmol/L (ref 135–146)
Total Protein: 6.3 g/dL (ref 6.1–8.1)

## 2015-12-06 LAB — CBC WITH DIFFERENTIAL/PLATELET
BASOS PCT: 0 %
Basophils Absolute: 0 cells/uL (ref 0–200)
Eosinophils Absolute: 228 cells/uL (ref 15–500)
Eosinophils Relative: 4 %
HEMATOCRIT: 40.1 % (ref 35.0–45.0)
HEMOGLOBIN: 13.7 g/dL (ref 11.7–15.5)
LYMPHS ABS: 2223 {cells}/uL (ref 850–3900)
Lymphocytes Relative: 39 %
MCH: 31 pg (ref 27.0–33.0)
MCHC: 34.2 g/dL (ref 32.0–36.0)
MCV: 90.7 fL (ref 80.0–100.0)
MONO ABS: 456 {cells}/uL (ref 200–950)
MPV: 9 fL (ref 7.5–12.5)
Monocytes Relative: 8 %
NEUTROS ABS: 2793 {cells}/uL (ref 1500–7800)
Neutrophils Relative %: 49 %
Platelets: 268 10*3/uL (ref 140–400)
RBC: 4.42 MIL/uL (ref 3.80–5.10)
RDW: 13.1 % (ref 11.0–15.0)
WBC: 5.7 10*3/uL (ref 3.8–10.8)

## 2015-12-06 LAB — LIPID PANEL
Cholesterol: 180 mg/dL (ref 125–200)
HDL: 50 mg/dL (ref >=46)
LDL Cholesterol: 116 mg/dL (ref <130)
Total CHOL/HDL Ratio: 3.6 Ratio (ref <=5.0)
Triglycerides: 69 mg/dL (ref <150)
VLDL: 14 mg/dL (ref <30)

## 2015-12-07 LAB — TSH: TSH: 3.08 m[IU]/L

## 2015-12-07 LAB — FOLLICLE STIMULATING HORMONE: FSH: 9 m[IU]/mL

## 2016-10-02 ENCOUNTER — Other Ambulatory Visit: Payer: Self-pay | Admitting: Gynecology

## 2016-10-02 DIAGNOSIS — Z1231 Encounter for screening mammogram for malignant neoplasm of breast: Secondary | ICD-10-CM

## 2016-10-03 ENCOUNTER — Encounter: Payer: Self-pay | Admitting: Gynecology

## 2016-10-03 ENCOUNTER — Ambulatory Visit (INDEPENDENT_AMBULATORY_CARE_PROVIDER_SITE_OTHER): Payer: 59 | Admitting: Gynecology

## 2016-10-03 VITALS — BP 122/78

## 2016-10-03 DIAGNOSIS — Z124 Encounter for screening for malignant neoplasm of cervix: Secondary | ICD-10-CM

## 2016-10-03 DIAGNOSIS — N921 Excessive and frequent menstruation with irregular cycle: Secondary | ICD-10-CM | POA: Diagnosis not present

## 2016-10-03 DIAGNOSIS — N951 Menopausal and female climacteric states: Secondary | ICD-10-CM | POA: Diagnosis not present

## 2016-10-03 LAB — CBC WITH DIFFERENTIAL/PLATELET
BASOS PCT: 0 %
Basophils Absolute: 0 cells/uL (ref 0–200)
EOS PCT: 3 %
Eosinophils Absolute: 195 cells/uL (ref 15–500)
HEMATOCRIT: 39.7 % (ref 35.0–45.0)
Hemoglobin: 13.7 g/dL (ref 11.7–15.5)
LYMPHS PCT: 35 %
Lymphs Abs: 2275 cells/uL (ref 850–3900)
MCH: 31.6 pg (ref 27.0–33.0)
MCHC: 34.5 g/dL (ref 32.0–36.0)
MCV: 91.7 fL (ref 80.0–100.0)
MONO ABS: 455 {cells}/uL (ref 200–950)
MPV: 9.2 fL (ref 7.5–12.5)
Monocytes Relative: 7 %
Neutro Abs: 3575 cells/uL (ref 1500–7800)
Neutrophils Relative %: 55 %
Platelets: 304 10*3/uL (ref 140–400)
RBC: 4.33 MIL/uL (ref 3.80–5.10)
RDW: 13.6 % (ref 11.0–15.0)
WBC: 6.5 10*3/uL (ref 3.8–10.8)

## 2016-10-03 MED ORDER — MEGESTROL ACETATE 40 MG PO TABS: 40.0000 mg | ORAL_TABLET | Freq: Two times a day (BID) | ORAL | 1 refills | Status: DC

## 2016-10-03 NOTE — Patient Instructions (Signed)

## 2016-10-03 NOTE — Progress Notes (Signed)
   Patient is a 51 year old that presented to the office today stating that her last menstrual period was February 24 and she has not stopped bleeding since then. She is on no hormone replacement therapy she's had previous tubal ligation. She reports having had 2 menstrual cycles in December and a normal cycle in January in the last one February 24. She denies any other symptoms.  Exam: Gen. appearance well-developed well-nourished female with above mentioned complaint Abdomen: Soft nontender no rebound or guarding Pelvic: Bartholin urethra Skene was within normal limits Vagina blood was present the vaginal vault Cervix no gross lesions Pap smear obtained The cervix was cleansed with Betadine solution and a sterile Pipelle was introduced into the uterine cavity in effort to obtain an endometrial biopsy. Moderate amount of tissue was obtained and was submitted for histological evaluation. Bimanual exam uterus anteverted normal size shape and consistency Adnexa: No palpable masses or tenderness Rectal exam not done  Assessment/plan: Perimenopausal patient with prolonged menstrual cycle endometrial biopsy and Pap smear done today results pending at time of this dictation. Patient will stop by the lab [facility] She'll return to the office next week for sonohysterogram as part of evaluation for prolonged menses. Literature information was provided on endometrial ablation cryoablation techniques such as her option. We'll further discuss treatment options at the results of the above tests are completed.

## 2016-10-03 NOTE — Addendum Note (Signed)
Addended by: Dayna BarkerGARDNER, KIMBERLY K on: 10/03/2016 11:16 AM   Modules accepted: Orders

## 2016-10-04 ENCOUNTER — Other Ambulatory Visit: Payer: Self-pay | Admitting: Gynecology

## 2016-10-04 DIAGNOSIS — N939 Abnormal uterine and vaginal bleeding, unspecified: Secondary | ICD-10-CM

## 2016-10-04 LAB — PAP IG W/ RFLX HPV ASCU

## 2016-10-04 LAB — FOLLICLE STIMULATING HORMONE: FSH: 8.2 m[IU]/mL

## 2016-10-11 ENCOUNTER — Encounter: Payer: Self-pay | Admitting: Gynecology

## 2016-10-11 ENCOUNTER — Ambulatory Visit (INDEPENDENT_AMBULATORY_CARE_PROVIDER_SITE_OTHER): Payer: 59 | Admitting: Gynecology

## 2016-10-11 ENCOUNTER — Ambulatory Visit (INDEPENDENT_AMBULATORY_CARE_PROVIDER_SITE_OTHER): Payer: 59

## 2016-10-11 DIAGNOSIS — N921 Excessive and frequent menstruation with irregular cycle: Secondary | ICD-10-CM

## 2016-10-11 DIAGNOSIS — N939 Abnormal uterine and vaginal bleeding, unspecified: Secondary | ICD-10-CM

## 2016-10-11 NOTE — Progress Notes (Signed)
   Patient is a 51 year old that presented to the office today as part of evaluation for dysfunctional uterine bleeding for sonohysterogram. Her history is as follows:  Patient was seen in the office on 10/03/2016 stating that her last menstrual period was February 24 and she has not stopped bleeding since then. She is on no hormone replacement therapy she's had previous tubal ligation. She reports having had 2 menstrual cycles in December and a normal cycle in January in the last one February 24. She denies any other symptoms. At that office visit she had a Pap smear along with endometrial biopsy and FSH. Her endometrial biopsy report demonstrated the following:   Diagnosis Endometrium, biopsy - MENSTRUAL ENDOMETRIUM. - NO HYPERPLASIA OR MALIGNANCY IDENTIFIED.  Her Pap smear was normal her FSH was normal. She was started on Megace 40 mg twice a day which stopped her bleeding she's here for some hysterogram. Her ultrasound/sono hysterogram today demonstrated the following:  Uterus measured 9.9 x 6.0 x 5.5 cm and the mutual stripe 11.5 mm. Right ovarian echo-free follicle measuring 10 x 12 mm. Left ovarian follicle 19 x 17 mm. No fluid in the cul-de-sac. The cervix is then cleansed with Betadine solution and a sterile catheter was introduced into the uterine cavity normal saline was instilled and no endometrial abnormality was noted. Excluding the flu endometrial stripe was a 0.3 mm.  Assessment/plan: Perimenopausal patient although FSH was normal with irregular bleeding with prior tubal ligation is a candidate for endometrial ablation the office via cryoablation. Patient previously been provided with literature information. We are going to schedule this in the next couple weeks here in the office. No that the sonohysterogram was negative and her endometrial biopsy and Pap smear were normal.

## 2016-10-11 NOTE — Patient Instructions (Signed)
Patient Sonohysterogram, Care After These instructions give you information on caring for yourself after your procedure. Your doctor may also give you more specific instructions. Call your doctor if you have any problems or questions after your procedure. Follow these instructions at home:  Take medicine only as told by your doctor.  Wear a pad if you have some light bleeding from your vagina.  Keep all follow-up visits as told by your doctor.  You can do all of your usual activities. Contact a doctor if:  You have chills or a fever.  You have bad cramping. Get help right away if:  You have a fever for more than 3 days.  You have very bad pain in your belly (abdomen).  You have pain in your belly that gets worse.  You have a lot of bleeding from your vagina. This information is not intended to replace advice given to you by your health care provider. Make sure you discuss any questions you have with your health care provider. Document Released: 08/11/2010 Document Revised: 12/15/2015 Document Reviewed: 06/22/2013 Elsevier Interactive Patient Education  2017 [employer]

## 2016-10-12 ENCOUNTER — Telehealth: Payer: Self-pay

## 2016-10-12 NOTE — Telephone Encounter (Signed)
I checked insurance benefits and called and left message for patient to call and discuss this with me and scheduling her ablation appointment.

## 2016-10-12 NOTE — Telephone Encounter (Signed)
I spoke with patient about ins benefits and her estimated GGA surgery prepayment due. She cannot afford the $2437.00 surgery prepayment.   She said you gave her a couple of options and she would like to go back on the birth control pill.  Also, she asked if she should continue Megace. I told her to take it until I can check with you and I will call her Monday to let her know what you recommend.

## 2016-10-15 ENCOUNTER — Other Ambulatory Visit: Payer: Self-pay | Admitting: Gynecology

## 2016-10-15 MED ORDER — LEVONORGESTREL-ETHINYL ESTRAD 0.1-20 MG-MCG PO TABS: 1.0000 | ORAL_TABLET | Freq: Every day | ORAL | 11 refills | Status: DC

## 2016-10-15 NOTE — Telephone Encounter (Signed)
Left detailed message on voice mail. Read her what Dr. Glenetta HewJF wrote in reply.  Rx for OC sent to her pharmacy as I relayed in the message that I would. Any questions to call me.

## 2016-10-15 NOTE — Telephone Encounter (Signed)
She can stop the Megace today and begin Aviane oral contraceptive pill which is a 20 g low-dose pill which she can continue to take for the next couple years into the menopause as she does not smoke or has any past history of clotting disorders. Call in one pack with 11 refills

## 2016-10-23 ENCOUNTER — Ambulatory Visit
Admission: RE | Admit: 2016-10-23 | Discharge: 2016-10-23 | Disposition: A | Discharge status: Home / Self Care | Payer: Managed Care, Other (non HMO) | Source: Ambulatory Visit | Attending: Gynecology | Admitting: Gynecology

## 2016-10-23 DIAGNOSIS — Z1231 Encounter for screening mammogram for malignant neoplasm of breast: Secondary | ICD-10-CM

## 2016-12-05 ENCOUNTER — Encounter: Payer: Self-pay | Admitting: Gynecology

## 2017-09-16 ENCOUNTER — Other Ambulatory Visit: Payer: Self-pay

## 2018-02-12 ENCOUNTER — Encounter: Payer: Self-pay | Admitting: Obstetrics & Gynecology

## 2018-02-12 ENCOUNTER — Ambulatory Visit (INDEPENDENT_AMBULATORY_CARE_PROVIDER_SITE_OTHER): Payer: 59 | Admitting: Obstetrics & Gynecology

## 2018-02-12 VITALS — BP 128/80 | Ht 62.75 in | Wt 183.0 lb

## 2018-02-12 DIAGNOSIS — N914 Secondary oligomenorrhea: Secondary | ICD-10-CM | POA: Diagnosis not present

## 2018-02-12 DIAGNOSIS — Z9851 Tubal ligation status: Secondary | ICD-10-CM

## 2018-02-12 DIAGNOSIS — Z01419 Encounter for gynecological examination (general) (routine) without abnormal findings: Secondary | ICD-10-CM | POA: Diagnosis not present

## 2018-02-12 MED ORDER — NORETHINDRONE 0.35 MG PO TABS: 1.0000 | ORAL_TABLET | Freq: Every day | ORAL | 4 refills | Status: DC

## 2018-02-12 NOTE — Progress Notes (Signed)
Kimberly Mcmahon 1965-11-04 [id number]   History:    52 y.o. G4P4L4 Married status post tubal ligation..    RP:  Established patient presenting for annual gyn exam   HPI: Oligomenorrhea, LMP 11/2017.  Ran out of BCPs Aviane in 10/2017.  No significant hot flashes or night sweats.  No pelvic pain.  No pain with intercourse.  Urine and bowel movements normal.  Breasts normal.  Health labs follow-up here for fasting.  Past medical history,surgical history, family history and social history were all reviewed and documented in the EPIC chart.  Gynecologic History Patient's last menstrual period was 12/13/2017. Contraception: tubal ligation Last Pap: 09/2016. Results were: Negative Last mammogram: 10/2016. Results were: Negative.  Will schedule screening Mammo now. Bone Density: Never Colonoscopy: Never, will schedule now  Obstetric History OB History  Gravida Para Term Preterm AB Living  _0 SAB TAB Ectopic Multiple Live Births          4    # Outcome Date GA Lbr Len/2nd Weight Sex Delivery Anes PTL Lv  4 Term     F Vag-Spont  N LIV  3 Term     M Vag-Spont  N LIV  2 Term     M Vag-Spont  N LIV  1 Term     M Vag-Spont  N LIV     ROS: A ROS was performed and pertinent positives and negatives are included in the history.  GENERAL: No fevers or chills. HEENT: No change in vision, no earache, sore throat or sinus congestion. NECK: No pain or stiffness. CARDIOVASCULAR: No chest pain or pressure. No palpitations. PULMONARY: No shortness of breath, cough or wheeze. GASTROINTESTINAL: No abdominal pain, nausea, vomiting or diarrhea, melena or bright red blood per rectum. GENITOURINARY: No urinary frequency, urgency, hesitancy or dysuria. MUSCULOSKELETAL: No joint or muscle pain, no back pain, no recent trauma. DERMATOLOGIC: No rash, no itching, no lesions. ENDOCRINE: No polyuria, polydipsia, no heat or cold intolerance. No recent change in weight. HEMATOLOGICAL: No anemia or easy bruising or  bleeding. NEUROLOGIC: No headache, seizures, numbness, tingling or weakness. PSYCHIATRIC: No depression, no loss of interest in normal activity or change in sleep pattern.     Exam:   BP 128/80   Ht 5' 2.75" (1.594 m)   Wt 183 lb (83 kg)   LMP 12/13/2017   BMI 32.68 kg/m   Body mass index is 32.68 kg/m.  General appearance : Well developed well nourished female. No acute distress HEENT: Eyes: no retinal hemorrhage or exudates,  Neck supple, trachea midline, no carotid bruits, no thyroidmegaly Lungs: Clear to auscultation, no rhonchi or wheezes, or rib retractions  Heart: Regular rate and rhythm, no murmurs or gallops Breast:Examined in sitting and supine position were symmetrical in appearance, no palpable masses or tenderness,  no skin retraction, no nipple inversion, no nipple discharge, no skin discoloration, no axillary or supraclavicular lymphadenopathy Abdomen: no palpable masses or tenderness, no rebound or guarding Extremities: no edema or skin discoloration or tenderness  Pelvic: Vulva: Normal             Vagina: No gross lesions or discharge  Cervix: No gross lesions or discharge  Uterus  AV, normal size, shape and consistency, non-tender and mobile  Adnexa  Without masses or tenderness  Anus: Normal   Assessment/Plan:  52 y.o. female for annual exam   1. Well female exam with routine gynecological exam Normal gynecologic exam.  Last Pap test March 2018 was negative.  Will repeat Pap test at 2 to 3 years.  Breast exam normal.  Last mammogram was negative in April 2018.  Patient will schedule screening mammogram now.  Follow-up here with fasting health labs.  Will help patient schedule her first screening colonoscopy. - CBC; Future - Comp Met (CMET); Future - TSH; Future - Lipid panel; Future - VITAMIN D 25 Hydroxy (Vit-D Deficiency, Fractures); Future  2. Tubal ligation status  3. Secondary oligomenorrhea Will control with the progestin only pill.  No  contraindication.  Usage reviewed and prescription sent to pharmacy.  Other orders - norethindrone (MICRONOR,CAMILA,ERRIN) 0.35 MG tablet; Take 1 tablet (0.35 mg total) by mouth daily.  Counseling on above issues and coordination of care more than 50% for 10 minutes.  Princess Bruins MD, 4:08 PM 02/12/2018

## 2018-02-13 ENCOUNTER — Other Ambulatory Visit: Payer: 59

## 2018-02-13 DIAGNOSIS — Z01419 Encounter for gynecological examination (general) (routine) without abnormal findings: Secondary | ICD-10-CM

## 2018-02-14 LAB — CBC
HEMATOCRIT: 38.5 % (ref 35.0–45.0)
HEMOGLOBIN: 13.3 g/dL (ref 11.7–15.5)
MCH: 31.3 pg (ref 27.0–33.0)
MCHC: 34.5 g/dL (ref 32.0–36.0)
MCV: 90.6 fL (ref 80.0–100.0)
MPV: 9.5 fL (ref 7.5–12.5)
Platelets: 291 10*3/uL (ref 140–400)
RBC: 4.25 10*6/uL (ref 3.80–5.10)
RDW: 12 % (ref 11.0–15.0)
WBC: 7.1 10*3/uL (ref 3.8–10.8)

## 2018-02-14 LAB — COMPREHENSIVE METABOLIC PANEL
AG Ratio: 1.9 (calc) (ref 1.0–2.5)
ALBUMIN MSPROF: 4.3 g/dL (ref 3.6–5.1)
ALT: 19 U/L (ref 6–29)
AST: 17 U/L (ref 10–35)
Alkaline phosphatase (APISO): 63 U/L (ref 33–130)
BUN: 18 mg/dL (ref 7–25)
CHLORIDE: 103 mmol/L (ref 98–110)
CO2: 26 mmol/L (ref 20–32)
Calcium: 9.4 mg/dL (ref 8.6–10.4)
Creat: 0.69 mg/dL (ref 0.50–1.05)
Globulin: 2.3 g/dL (calc) (ref 1.9–3.7)
Glucose, Bld: 81 mg/dL (ref 65–99)
POTASSIUM: 4 mmol/L (ref 3.5–5.3)
Sodium: 139 mmol/L (ref 135–146)
Total Bilirubin: 0.8 mg/dL (ref 0.2–1.2)
Total Protein: 6.6 g/dL (ref 6.1–8.1)

## 2018-02-14 LAB — LIPID PANEL
CHOL/HDL RATIO: 4 (calc) (ref <5.0)
CHOLESTEROL: 211 mg/dL — AB (ref <200)
HDL: 53 mg/dL (ref >50)
LDL Cholesterol (Calc): 137 mg/dL (calc) — ABNORMAL HIGH
Non-HDL Cholesterol (Calc): 158 mg/dL (calc) — ABNORMAL HIGH (ref <130)
TRIGLYCERIDES: 99 mg/dL (ref <150)

## 2018-02-14 LAB — TSH: TSH: 2.96 m[IU]/L

## 2018-02-14 LAB — VITAMIN D 25 HYDROXY (VIT D DEFICIENCY, FRACTURES): VIT D 25 HYDROXY: 37 ng/mL (ref 30–100)

## 2018-02-16 ENCOUNTER — Encounter: Payer: Self-pay | Admitting: Obstetrics & Gynecology

## 2018-02-16 NOTE — Patient Instructions (Signed)
1. Well female exam with routine gynecological exam Normal gynecologic exam.  Last Pap test March 2018 was negative.  Will repeat Pap test at 2 to 3 years.  Breast exam normal.  Last mammogram was negative in April 2018.  Patient will schedule screening mammogram now.  Follow-up here with fasting health labs.  Will help patient schedule her first screening colonoscopy. - CBC; Future - Comp Met (CMET); Future - TSH; Future - Lipid panel; Future - VITAMIN D 25 Hydroxy (Vit-D Deficiency, Fractures); Future  2. Tubal ligation status  3. Secondary oligomenorrhea Will control with the progestin only pill.  No contraindication.  Usage reviewed and prescription sent to pharmacy.  Other orders - norethindrone (MICRONOR,CAMILA,ERRIN) 0.35 MG tablet; Take 1 tablet (0.35 mg total) by mouth daily.  Kimberly Mcmahon, it was a pleasure meeting you today!  I will inform you of all your results as soon as they are available.

## 2018-02-17 ENCOUNTER — Other Ambulatory Visit: Payer: Self-pay | Admitting: Obstetrics & Gynecology

## 2018-02-17 DIAGNOSIS — Z1231 Encounter for screening mammogram for malignant neoplasm of breast: Secondary | ICD-10-CM

## 2018-03-11 ENCOUNTER — Ambulatory Visit
Admission: RE | Admit: 2018-03-11 | Discharge: 2018-03-11 | Disposition: A | Discharge status: Home / Self Care | Payer: 59 | Source: Ambulatory Visit | Attending: Obstetrics & Gynecology | Admitting: Obstetrics & Gynecology

## 2018-03-11 DIAGNOSIS — Z1231 Encounter for screening mammogram for malignant neoplasm of breast: Secondary | ICD-10-CM

## 2018-07-10 ENCOUNTER — Telehealth: Payer: Self-pay | Admitting: *Deleted

## 2018-07-10 NOTE — Telephone Encounter (Signed)
Patient called requesting nystatin cream for itchy breast/rash under breast. Dr. Lily PeerFernandez prescribed in 2017 for same issue. Rx has now expired. Please advise

## 2018-07-11 MED ORDER — NYSTATIN 100000 UNIT/GM EX CREA: 1.0000 "application " | TOPICAL_CREAM | Freq: Two times a day (BID) | CUTANEOUS | 1 refills | Status: DC

## 2018-07-11 NOTE — Telephone Encounter (Signed)
Agree with Nystatin, but for the under breast, I usually prescribe the powder so that the humidity is absorbed...  Offer her the choice please.

## 2018-07-11 NOTE — Telephone Encounter (Signed)
Rx sent left detailed on voicemail.

## 2019-02-24 ENCOUNTER — Other Ambulatory Visit: Payer: Self-pay | Admitting: Obstetrics & Gynecology

## 2019-02-24 DIAGNOSIS — Z1231 Encounter for screening mammogram for malignant neoplasm of breast: Secondary | ICD-10-CM

## 2019-04-09 ENCOUNTER — Ambulatory Visit
Admission: RE | Admit: 2019-04-09 | Discharge: 2019-04-09 | Disposition: A | Discharge status: Home / Self Care | Payer: 59 | Source: Ambulatory Visit | Attending: Obstetrics & Gynecology | Admitting: Obstetrics & Gynecology

## 2019-04-09 ENCOUNTER — Other Ambulatory Visit: Payer: Self-pay

## 2019-04-09 DIAGNOSIS — Z1231 Encounter for screening mammogram for malignant neoplasm of breast: Secondary | ICD-10-CM

## 2019-05-04 ENCOUNTER — Other Ambulatory Visit: Payer: Self-pay

## 2019-05-05 ENCOUNTER — Encounter: Payer: Self-pay | Admitting: Obstetrics & Gynecology

## 2019-05-05 ENCOUNTER — Ambulatory Visit: Payer: 59 | Admitting: Obstetrics & Gynecology

## 2019-05-05 VITALS — BP 130/84 | Ht 63.0 in | Wt 189.0 lb

## 2019-05-05 DIAGNOSIS — Z01419 Encounter for gynecological examination (general) (routine) without abnormal findings: Secondary | ICD-10-CM

## 2019-05-05 DIAGNOSIS — Z9851 Tubal ligation status: Secondary | ICD-10-CM

## 2019-05-05 DIAGNOSIS — E661 Drug-induced obesity: Secondary | ICD-10-CM

## 2019-05-05 DIAGNOSIS — Z6833 Body mass index (BMI) 33.0-33.9, adult: Secondary | ICD-10-CM

## 2019-05-05 DIAGNOSIS — N951 Menopausal and female climacteric states: Secondary | ICD-10-CM | POA: Diagnosis not present

## 2019-05-05 NOTE — Progress Notes (Signed)
  Kimberly Mcmahon 07/21/1966 [id number]   History:    53 y.o. G4P4L4 Married.  S/P TL.  RP:  Established patient presenting for annual gyn exam   HPI: Oligomenorrhea with menses about every 3 months, light flow.  No pelvic pain.  No significant hot flushes or night sweats.  No pain with intercourse.  Urine and bowel movements normal.  Breasts normal.  Body mass index 33.48.  Needs to increase physical activities.  Will do fasting health labs here now.  No colonoscopy yet.  Past medical history,surgical history, family history and social history were all reviewed and documented in the EPIC chart.  Gynecologic History Patient's last menstrual period was 12/13/2017. Contraception: tubal ligation Last Pap: 09/2016. Results were: Negative Last mammogram: 03/2019. Results were: Negative Bone Density: Never Colonoscopy: Will refer to Gastro now.  Obstetric History OB History  Gravida Para Term Preterm AB Living  4 4 4     4  SAB TAB Ectopic Multiple Live Births          4    # Outcome Date GA Lbr Len/2nd Weight Sex Delivery Anes PTL Lv  4 Term     F Vag-Spont  N LIV  3 Term     M Vag-Spont  N LIV  2 Term     M Vag-Spont  N LIV  1 Term     M Vag-Spont  N LIV     ROS: A ROS was performed and pertinent positives and negatives are included in the history.  GENERAL: No fevers or chills. HEENT: No change in vision, no earache, sore throat or sinus congestion. NECK: No pain or stiffness. CARDIOVASCULAR: No chest pain or pressure. No palpitations. PULMONARY: No shortness of breath, cough or wheeze. GASTROINTESTINAL: No abdominal pain, nausea, vomiting or diarrhea, melena or bright red blood per rectum. GENITOURINARY: No urinary frequency, urgency, hesitancy or dysuria. MUSCULOSKELETAL: No joint or muscle pain, no back pain, no recent trauma. DERMATOLOGIC: No rash, no itching, no lesions. ENDOCRINE: No polyuria, polydipsia, no heat or cold intolerance. No recent change in weight. HEMATOLOGICAL: No  anemia or easy bruising or bleeding. NEUROLOGIC: No headache, seizures, numbness, tingling or weakness. PSYCHIATRIC: No depression, no loss of interest in normal activity or change in sleep pattern.     Exam:   BP 130/84   Ht 5' 3" (1.6 m)   Wt 189 lb (85.7 kg)   LMP 12/13/2017   BMI 33.48 kg/m   Body mass index is 33.48 kg/m.  General appearance : Well developed well nourished female. No acute distress HEENT: Eyes: no retinal hemorrhage or exudates,  Neck supple, trachea midline, no carotid bruits, no thyroidmegaly Lungs: Clear to auscultation, no rhonchi or wheezes, or rib retractions  Heart: Regular rate and rhythm, no murmurs or gallops Breast:Examined in sitting and supine position were symmetrical in appearance, no palpable masses or tenderness,  no skin retraction, no nipple inversion, no nipple discharge, no skin discoloration, no axillary or supraclavicular lymphadenopathy Abdomen: no palpable masses or tenderness, no rebound or guarding Extremities: no edema or skin discoloration or tenderness  Pelvic: Vulva: Normal             Vagina: No gross lesions or discharge  Cervix: No gross lesions or discharge.  Pap reflex done.  Uterus  AV, normal size, shape and consistency, non-tender and mobile  Adnexa  Without masses or tenderness  Anus: Normal   Assessment/Plan:  53 y.o. female for annual exam   1. Encounter   for routine gynecological examination with Papanicolaou smear of cervix Normal gynecologic exam.  Pap reflex done.  Breast exam normal.  Last screening mammogram September 2020 was negative.  Referring patient to gastro for first screening colonoscopy.  Fasting health labs here today. - CBC; Future - Comp Met (CMET); Future - Lipid panel; Future - TSH; Future - VITAMIN D 25 Hydroxy (Vit-D Deficiency, Fractures); Future  2. Tubal ligation status  3. Perimenopause Will observe as patient is having menstrual periods about every 3 months with normal flow and  minimal vasomotor symptoms.  Precautions given for eventual abnormal vaginal bleeding.  4. 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  Aerobic physical activities 5 times a week and weightlifting every 2 days.  Princess Bruins MD, 3:54 PM 05/05/2019

## 2019-05-06 ENCOUNTER — Other Ambulatory Visit: Payer: Self-pay

## 2019-05-06 ENCOUNTER — Other Ambulatory Visit: Payer: 59

## 2019-05-06 ENCOUNTER — Encounter: Payer: Self-pay | Admitting: *Deleted

## 2019-05-06 DIAGNOSIS — Z01419 Encounter for gynecological examination (general) (routine) without abnormal findings: Secondary | ICD-10-CM

## 2019-05-07 LAB — CBC
HCT: 37.1 % (ref 35.0–45.0)
Hemoglobin: 12.7 g/dL (ref 11.7–15.5)
MCH: 31.1 pg (ref 27.0–33.0)
MCHC: 34.2 g/dL (ref 32.0–36.0)
MCV: 90.9 fL (ref 80.0–100.0)
MPV: 9.6 fL (ref 7.5–12.5)
Platelets: 305 10*3/uL (ref 140–400)
RBC: 4.08 10*6/uL (ref 3.80–5.10)
RDW: 12.4 % (ref 11.0–15.0)
WBC: 6.8 10*3/uL (ref 3.8–10.8)

## 2019-05-07 LAB — LIPID PANEL
Cholesterol: 187 mg/dL (ref <200)
HDL: 51 mg/dL (ref >=50)
LDL Cholesterol (Calc): 115 mg/dL (calc) — ABNORMAL HIGH
Non-HDL Cholesterol (Calc): 136 mg/dL (calc) — ABNORMAL HIGH (ref <130)
Total CHOL/HDL Ratio: 3.7 (calc) (ref <5.0)
Triglycerides: 103 mg/dL (ref <150)

## 2019-05-07 LAB — VITAMIN D 25 HYDROXY (VIT D DEFICIENCY, FRACTURES): Vit D, 25-Hydroxy: 31 ng/mL (ref 30–100)

## 2019-05-07 LAB — COMPREHENSIVE METABOLIC PANEL
AG Ratio: 1.9 (calc) (ref 1.0–2.5)
ALT: 18 U/L (ref 6–29)
AST: 16 U/L (ref 10–35)
Albumin: 4.2 g/dL (ref 3.6–5.1)
Alkaline phosphatase (APISO): 60 U/L (ref 37–153)
BUN: 12 mg/dL (ref 7–25)
CO2: 27 mmol/L (ref 20–32)
Calcium: 9.3 mg/dL (ref 8.6–10.4)
Chloride: 102 mmol/L (ref 98–110)
Creat: 0.71 mg/dL (ref 0.50–1.05)
Globulin: 2.2 g/dL (calc) (ref 1.9–3.7)
Glucose, Bld: 75 mg/dL (ref 65–99)
Potassium: 4 mmol/L (ref 3.5–5.3)
Sodium: 139 mmol/L (ref 135–146)
Total Bilirubin: 0.6 mg/dL (ref 0.2–1.2)
Total Protein: 6.4 g/dL (ref 6.1–8.1)

## 2019-05-07 LAB — TSH: TSH: 2.48 mIU/L

## 2019-05-12 LAB — PAP IG W/ RFLX HPV ASCU

## 2019-05-12 LAB — HUMAN PAPILLOMAVIRUS, HIGH RISK: HPV DNA High Risk: NOT DETECTED

## 2019-05-13 ENCOUNTER — Encounter: Payer: Self-pay | Admitting: Obstetrics & Gynecology

## 2019-05-13 NOTE — Patient Instructions (Signed)
1. Encounter for routine gynecological examination with Papanicolaou smear of cervix Normal gynecologic exam.  Pap reflex done.  Breast exam normal.  Last screening mammogram September 2020 was negative.  Referring patient to gastro for first screening colonoscopy.  Fasting health labs here today. - CBC; Future - Comp Met (CMET); Future - Lipid panel; Future - TSH; Future - VITAMIN D 25 Hydroxy (Vit-D Deficiency, Fractures); Future  2. Tubal ligation status  3. Perimenopause Will observe as patient is having menstrual periods about every 3 months with normal flow and minimal vasomotor symptoms.  Precautions given for eventual abnormal vaginal bleeding.  4. 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  Aerobic physical activities 5 times a week and weightlifting every 2 days.  Doris, it was a pleasure seeing you today!  I will inform you of your results as soon as they are available.

## 2019-08-18 ENCOUNTER — Encounter: Payer: Self-pay | Admitting: Women's Health

## 2019-08-18 ENCOUNTER — Ambulatory Visit: Payer: 59 | Admitting: Women's Health

## 2019-08-18 ENCOUNTER — Telehealth: Payer: Self-pay | Admitting: *Deleted

## 2019-08-18 ENCOUNTER — Other Ambulatory Visit: Payer: Self-pay

## 2019-08-18 VITALS — BP 126/80

## 2019-08-18 DIAGNOSIS — L308 Other specified dermatitis: Secondary | ICD-10-CM

## 2019-08-18 MED ORDER — BETAMETHASONE VALERATE 0.1 % EX OINT: 1.0000 "application " | TOPICAL_OINTMENT | Freq: Two times a day (BID) | CUTANEOUS | 0 refills | Status: AC

## 2019-08-18 NOTE — Progress Notes (Signed)
55 year old MWF G4 P4 presents with complaint of itchy rash on right breast on areola for the past few weeks, has had intermittently over the past few years.  Denies palpable nodules, nipple discharge, change in exam.  Normal mammogram 03/2019.  Questions if she has Paget's disease.  Also reports a rash under her left breast from perspiration.  Has had increased hot flushes over the last few months, last cycle at least 3 months ago but has had cycles every 3 to 4 months for the past year.  BTL.  Medical problems include   Exam: Appears well, worried and anxious.  Breast pendulous under left breast erythemic yeast type rash, left breast on the areola 3 distinct superficial areas eczema in appearance of scaly erythemic patches, all less than 2 cm.  In sitting and lying position no dimpling, erythema, retractions, nipple discharge, palpable nodules or masses.  Reviewed most likely eczema on left areola  Plan: Options reviewed to treat with Valisone.1% small amount twice daily for several days to see if these areas resolve or to proceed with diagnostic mammogram would like to proceed with diagnostic mammogram of right breast.  Encouraged to keep skin dry under breasts, can also use small amount of Valisone to those areas as well.  Reviewed perimenopausal at this time causing hot flashes.  Reassurance given regarding most likely not Paget's disease.

## 2019-08-18 NOTE — Telephone Encounter (Signed)
-----   Message from Harrington Challenger, NP sent at 08/18/2019  4:16 PM EST ----- Rt breast Diagnostic mammogram after 3p.  Breast center   mammogram 9/20.  Has had superficial rash around nipple , clears and returns.  Had had intermit for years.  Increased and now scaly since now having increased hot flushes.  (Pt worried about pagents disease)

## 2019-08-19 NOTE — Telephone Encounter (Signed)
Patient informed. 

## 2019-08-19 NOTE — Telephone Encounter (Signed)
Patient scheduled at breast center 09/07/19 @ 3:00pm arrival time.   Left message for patient to call.

## 2019-09-07 ENCOUNTER — Ambulatory Visit: Payer: 59

## 2019-09-07 ENCOUNTER — Other Ambulatory Visit: Payer: Self-pay

## 2019-09-07 ENCOUNTER — Ambulatory Visit
Admission: RE | Admit: 2019-09-07 | Discharge: 2019-09-07 | Disposition: A | Discharge status: Home / Self Care | Payer: 59 | Source: Ambulatory Visit | Attending: Women's Health | Admitting: Women's Health

## 2019-09-07 DIAGNOSIS — L308 Other specified dermatitis: Secondary | ICD-10-CM

## 2020-12-19 IMAGING — MG MM DIGITAL DIAGNOSTIC UNILAT*R* W/ TOMO W/ CAD
6 series · 6 of 18 positions shown · non-contrast
Comparison: Previous exam(s).

CLINICAL DATA: Patient reports a periareolar rash on the right. Her
referring clinician has given her a cream for this rash, and the
rash has now resolved.

EXAM:
DIGITAL DIAGNOSTIC UNILATERAL RIGHT MAMMOGRAM WITH CAD AND TOMO

[R MLO synth-2D (1 of 2)]
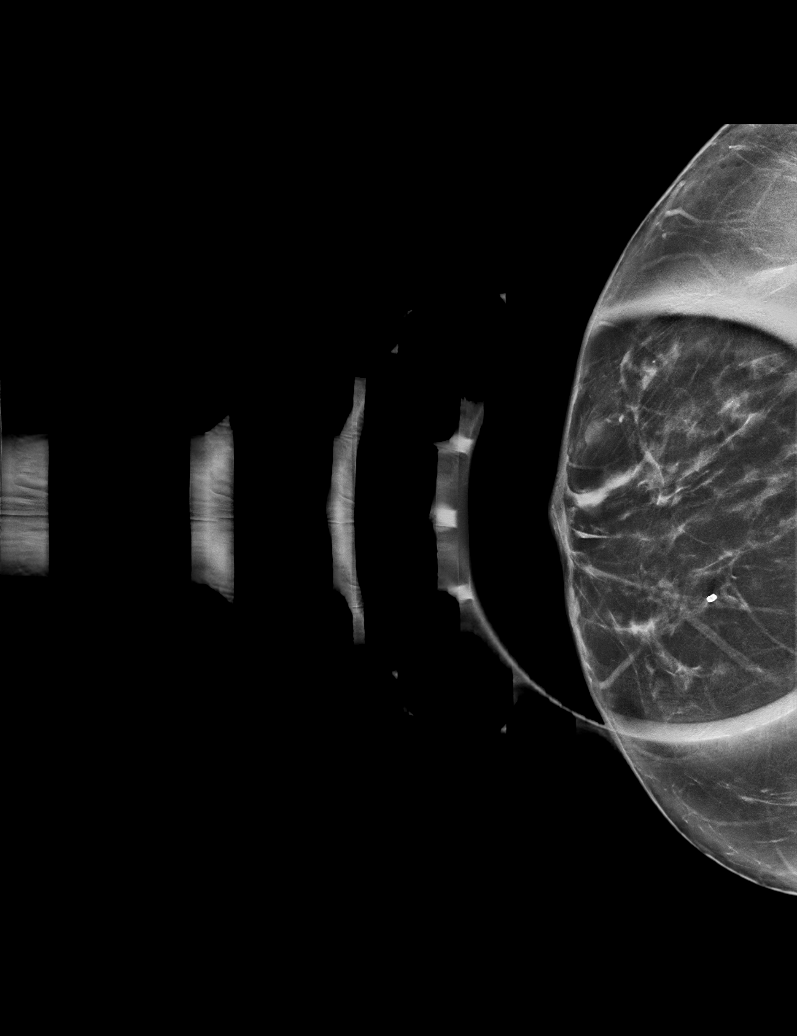

[R CC synth-2D]
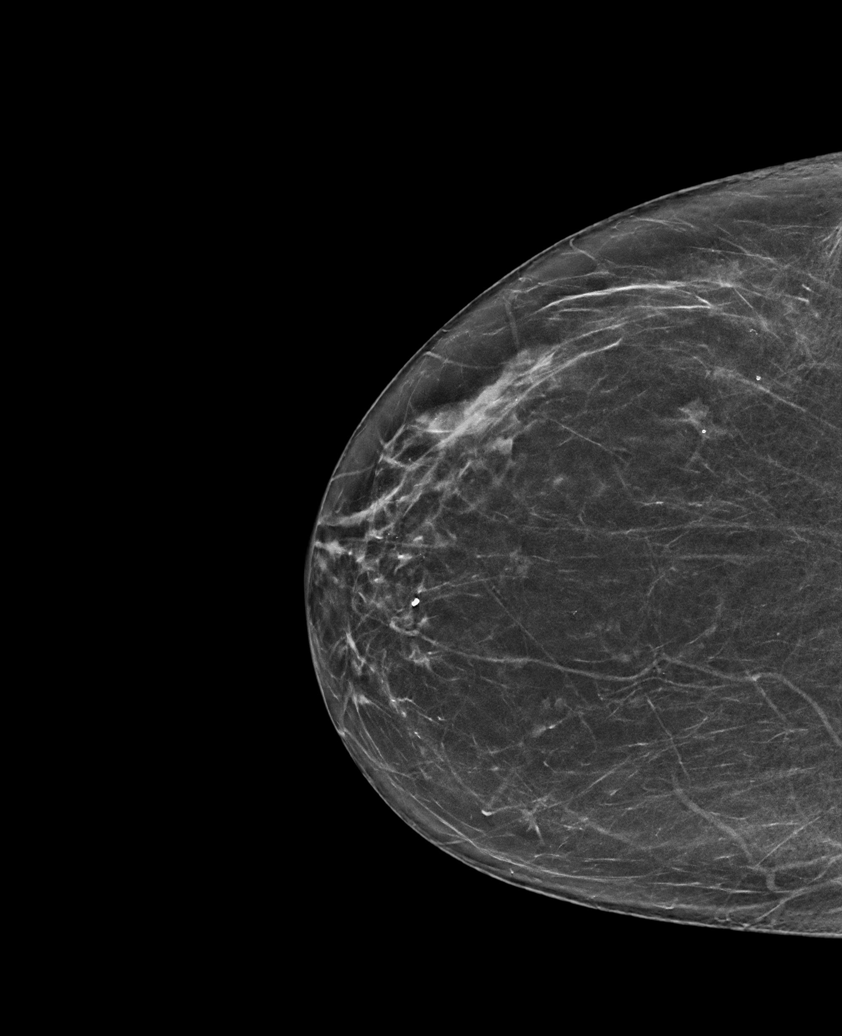

[R MLO synth-2D (2 of 2)]
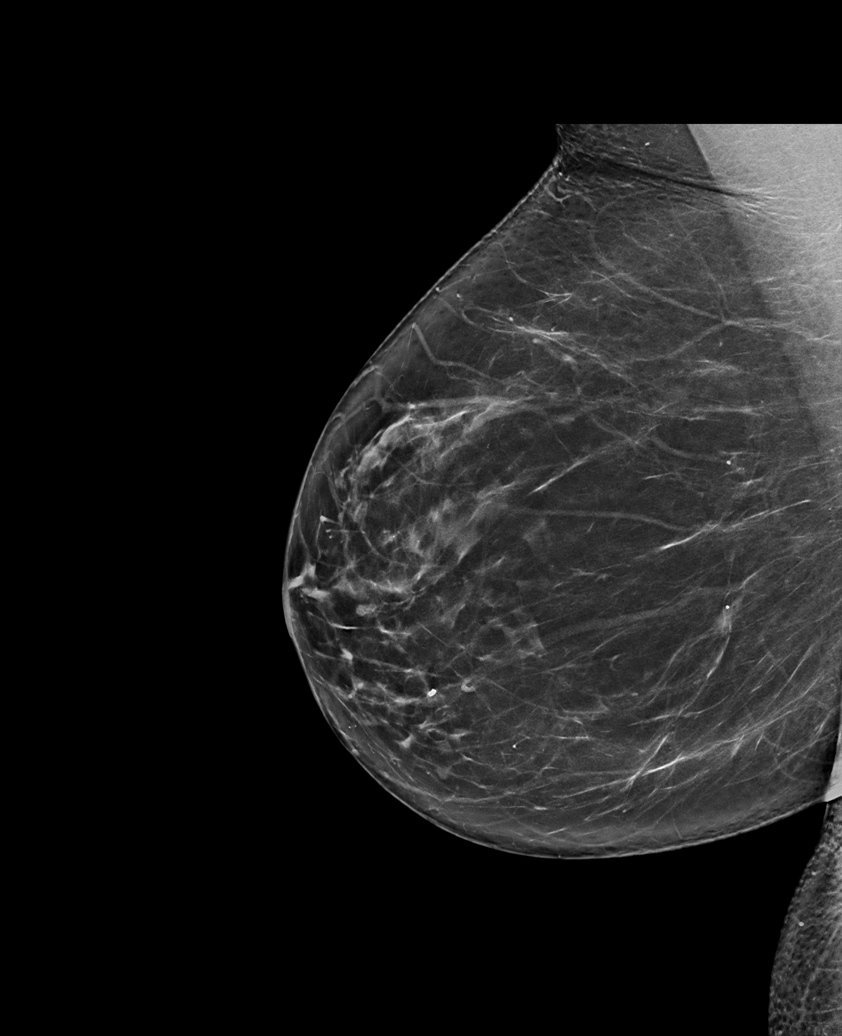

[R CC tomo · tomo slice 35/69.0]
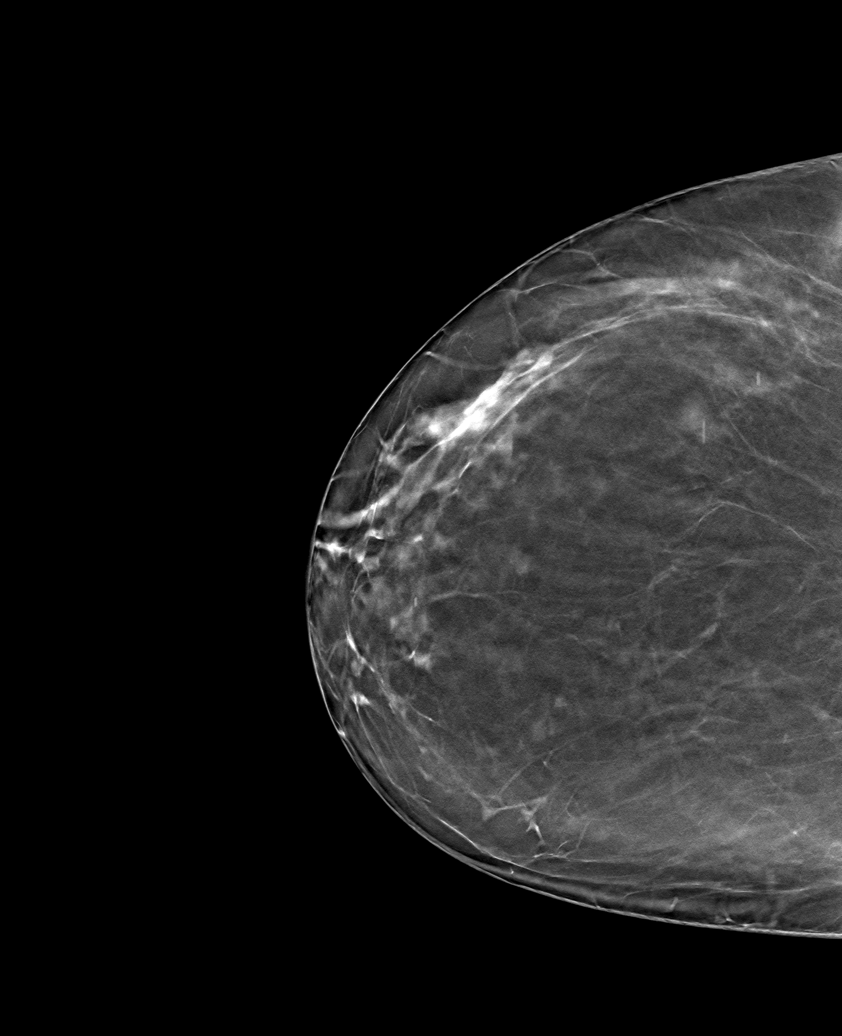

[R MLO tomo (1 of 2) · tomo slice 41/81.0]
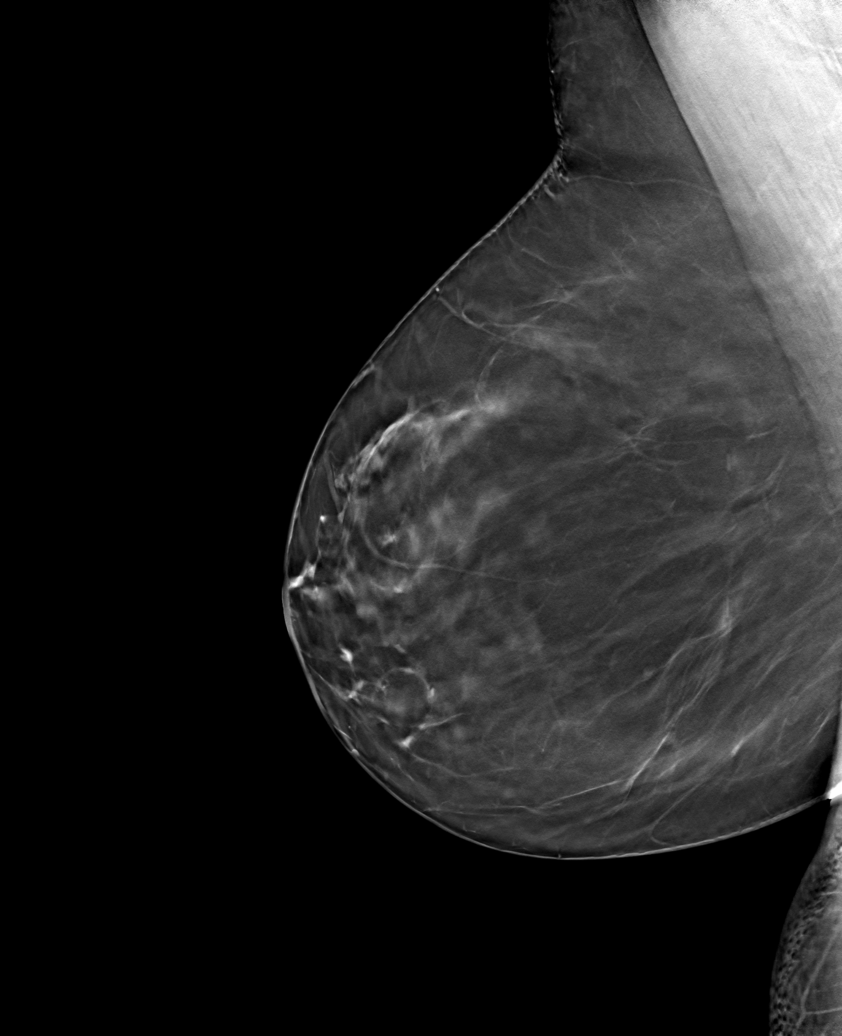

[R MLO tomo (2 of 2) · tomo slice 32/63.0]
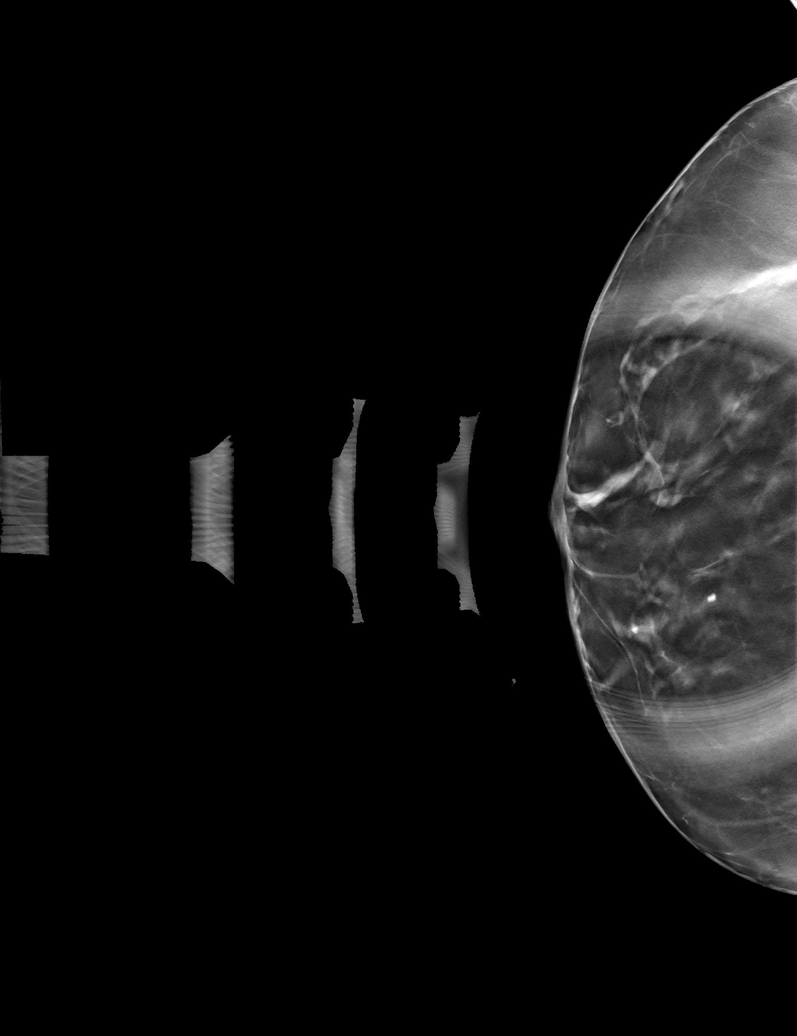

[6 of 18 positions shown; findings below may reference images not displayed]

ACR Breast Density Category b: There are scattered areas of
fibroglandular density.
FINDINGS: There are no new or suspicious masses, no areas of architectural
distortion, no areas of significant asymmetry and no suspicious
calcifications. No mammographic change.

Mammographic images were processed with CAD.
IMPRESSION: Negative exam.  No evidence of breast malignancy.

RECOMMENDATION:
Screening mammogram in March 2020, last screening study dated
April 09, 2019.(Code:[id number])

I have discussed the findings and recommendations with the patient.
If applicable, a reminder letter will be sent to the patient
regarding the next appointment.

BI-RADS CATEGORY  1: Negative.

## 2023-10-05 ENCOUNTER — Other Ambulatory Visit: Payer: Self-pay

## 2023-10-05 ENCOUNTER — Emergency Department (HOSPITAL_COMMUNITY)
Admission: EM | Admit: 2023-10-05 | Discharge: 2023-10-05 | Disposition: A | Discharge status: Home / Self Care | Payer: Self-pay | Attending: Student | Admitting: Student

## 2023-10-05 ENCOUNTER — Encounter (HOSPITAL_COMMUNITY): Payer: Self-pay

## 2023-10-05 DIAGNOSIS — M25531 Pain in right wrist: Secondary | ICD-10-CM | POA: Insufficient documentation

## 2023-10-05 MED ORDER — PREDNISONE 20 MG PO TABS: 40.0000 mg | ORAL_TABLET | Freq: Every day | ORAL | 0 refills | Status: AC

## 2023-10-05 NOTE — ED Provider Notes (Signed)
 [place] [facility] AT [facility] Provider Note   CSN: [id number] Arrival date & time: 10/05/23  0730     History  Chief Complaint  Patient presents with   Arm Pain    Kimberly Mcmahon is a 58 y.o. female with past medical history of anxiety, ganglion cyst of wrist presenting to emergency room with complaint of right sided wrist pain.  She is right-handed.  Patient reports that this started 10 days ago.  She reports right posterior lateral aspect of her forearm into her thumb has been intermittently sore.  She is not currently having pain.  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  Has also noticed discomfort when she is driving.  She reports her pain seems to be getting better  She has had no injury trauma or fall.  She has had no associated weakness, numbness or tingling.  Has not had any fevers or chills.  No open wounds or skin rash.   Arm Pain       Home Medications Prior to Admission medications   Medication Sig Start Date End Date Taking? Authorizing Provider  ALPRAZolam (XANAX) 0.25 MG tablet Take 0.25 mg by mouth 3 (three) times daily. 08/05/19   [provider]  ALPRAZolam Prudy Feeler) 0.5 MG tablet Take 0.5 mg by mouth at bedtime as needed.      [provider]  betamethasone valerate ointment (VALISONE) 0.1 % Apply 1 application topically 2 (two) times daily. 08/18/19   Harrington Challenger, NP  predniSONE (DELTASONE) 20 MG tablet Take 2 tablets (40 mg total) by mouth daily. 10/05/23  Yes Jelitza Manninen, Horald Chestnut, PA-C      Allergies    Patient has no known allergies.    Review of Systems   Review of Systems  Physical Exam Updated Vital Signs BP (!) 166/102   Pulse (!) 108   Temp 98.4 F (36.9 C)   Resp 14   Ht 5\' 3"  (1.6 m)   Wt 83.9 kg   LMP 12/13/2017   SpO2 100%   BMI 32.77 kg/m  Physical Exam  ED Results /  Procedures / Treatments   Labs (all labs ordered are listed, but only abnormal results are displayed) Labs Reviewed - No data to display  EKG None  Radiology No results found.  Procedures Procedures    Medications Ordered in ED Medications - No data to display  ED Course/ Medical Decision Making/ A&P                                 Medical Decision Making Risk Prescription drug management.   This patient presents to the ED for concern of wrist pain, this involves an extensive number of treatment options, and is a complaint that carries with it a high risk of complications and morbidity.  The differential diagnosis includes osteoarthritis, septic joint, de Quervain's tenosynovitis, carpal tunnel, or fracture, sprain, cellulitis, vascular problem, dvt   Problem List / ED Course / Critical interventions / Medication management  Patient has right wrist pain. 10 days. Offered pain medication patient declined reporting pain is very mild now.  Offered further workup however patient has had no injury trauma or fall thus I do not feel x-ray would be beneficial at this time.  She has no cellulitis  or sign of infection.  She has no obvious swelling at all over joint.  Since she does not have lower extremity swelling or discoloration doubt blood clot as cause of pain. Strong radial pulse, equal bilaterally.  She is able to move her hand has normal range of motion.  She has normal strength that is equal bilaterally.  Neurovascularly intact. Exam consistent with tendonitis. Will have her try steroid and wrist brace. Then follow with PCP.  I have reviewed the patients home medicines and have made adjustments as needed   Plan  F/u w/ PCP in 2-3d to ensure resolution of sx.  Patient was given return precautions. Patient stable for discharge at this time.  Patient educated on sx/dx and verbalized understanding of plan. Return to ER w/ new or worsening sx.          Final Clinical  Impression(s) / ED Diagnoses Final diagnoses:  Right wrist pain    Rx / DC Orders ED Discharge Orders          Ordered    predniSONE (DELTASONE) 20 MG tablet  Daily        10/05/23 0751              Hjalmar Ballengee, Horald Chestnut, PA-C 10/05/23 1610    Glendora Score, MD 10/06/23 2106

## 2023-10-05 NOTE — ED Triage Notes (Signed)
 C/o pain in  right forearm onset last fri. 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 No injury she is aware of . Positive right radial pulse.

## 2023-10-05 NOTE — Discharge Instructions (Addendum)
 Take prednisone which is a steroid as prescribed.  You can take Tylenol 1000mg  every 6 hours. You can also take antiinflammatories. You ice, rest, and elevate. Follow up with PCP. Return with new or worsening symptoms.
